# Patient Record
Sex: Female | Born: 1985 | Race: White | Hispanic: No | Smoking: Former smoker
Health system: Southern US, Community
[De-identification: ages and names within clinical notes are randomized; demographics above are authoritative.]

## PROBLEM LIST (undated history)

## (undated) ENCOUNTER — Inpatient Hospital Stay (HOSPITAL_COMMUNITY): Payer: Self-pay

## (undated) DIAGNOSIS — R87629 Unspecified abnormal cytological findings in specimens from vagina: Secondary | ICD-10-CM

## (undated) DIAGNOSIS — R8789 Other abnormal findings in specimens from female genital organs: Secondary | ICD-10-CM

## (undated) DIAGNOSIS — F32A Depression, unspecified: Secondary | ICD-10-CM

## (undated) DIAGNOSIS — Z87898 Personal history of other specified conditions: Secondary | ICD-10-CM

## (undated) DIAGNOSIS — IMO0002 Reserved for concepts with insufficient information to code with codable children: Secondary | ICD-10-CM

## (undated) DIAGNOSIS — Z3490 Encounter for supervision of normal pregnancy, unspecified, unspecified trimester: Principal | ICD-10-CM

## (undated) DIAGNOSIS — F329 Major depressive disorder, single episode, unspecified: Secondary | ICD-10-CM

## (undated) DIAGNOSIS — F419 Anxiety disorder, unspecified: Secondary | ICD-10-CM

## (undated) DIAGNOSIS — K219 Gastro-esophageal reflux disease without esophagitis: Secondary | ICD-10-CM

## (undated) DIAGNOSIS — R87619 Unspecified abnormal cytological findings in specimens from cervix uteri: Secondary | ICD-10-CM

## (undated) HISTORY — DX: Other abnormal findings in specimens from female genital organs: R87.89

## (undated) HISTORY — DX: Unspecified abnormal cytological findings in specimens from cervix uteri: R87.619

## (undated) HISTORY — DX: Gastro-esophageal reflux disease without esophagitis: K21.9

## (undated) HISTORY — DX: Encounter for supervision of normal pregnancy, unspecified, unspecified trimester: Z34.90

## (undated) HISTORY — DX: Anxiety disorder, unspecified: F41.9

## (undated) HISTORY — DX: Reserved for concepts with insufficient information to code with codable children: IMO0002

## (undated) HISTORY — PX: LEEP: SHX91

## (undated) HISTORY — DX: Depression, unspecified: F32.A

## (undated) HISTORY — DX: Major depressive disorder, single episode, unspecified: F32.9

## (undated) HISTORY — PX: DENTAL EXAMINATION UNDER ANESTHESIA: SHX1447

## (undated) HISTORY — DX: Personal history of other specified conditions: Z87.898

## (undated) HISTORY — DX: Unspecified abnormal cytological findings in specimens from vagina: R87.629

---
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
## 2007-05-26 ENCOUNTER — Emergency Department (HOSPITAL_COMMUNITY): Admission: EM | Admit: 2007-05-26 | Discharge: 2007-05-26 | Payer: Self-pay | Admitting: Emergency Medicine

## 2008-03-20 ENCOUNTER — Emergency Department (HOSPITAL_COMMUNITY): Admission: EM | Admit: 2008-03-20 | Discharge: 2008-03-21 | Payer: Self-pay | Admitting: Emergency Medicine

## 2011-02-21 ENCOUNTER — Other Ambulatory Visit (HOSPITAL_COMMUNITY)
Admission: RE | Admit: 2011-02-21 | Discharge: 2011-02-21 | Disposition: A | Discharge status: Home / Self Care | Payer: Medicaid Other | Source: Ambulatory Visit | Attending: Obstetrics & Gynecology | Admitting: Obstetrics & Gynecology

## 2011-02-21 DIAGNOSIS — Z01419 Encounter for gynecological examination (general) (routine) without abnormal findings: Secondary | ICD-10-CM | POA: Insufficient documentation

## 2011-05-12 NOTE — Discharge Summary (Signed)
NAMEMCKYNZIE, LIWANAG             ACCOUNT NO.:  [id number]   MEDICAL RECORD NO.:  [id number]          PATIENT TYPE:  OBV   LOCATION:  A415                          FACILITY:  [facility]   PHYSICIAN:  Tilda Burrow, M.D. DATE OF BIRTH:  Mar 23, 1986   DATE OF ADMISSION:  08/29/2006  DATE OF DISCHARGE:  LH                                 DISCHARGE SUMMARY   HISTORY OF PRESENT ILLNESS:  Javaeh is an 25 year old gravida 1, EDC  September 11, who is in with mild uterine contraction approximately 20  minutes apart.  Medical history is positive for depression and reflux.  Surgical history is positive for a LEEP.   ALLERGIES:  No known drug allergies.   Prenatal course essentially uneventful.  Blood type O positive.  UDS  positive for THC.  Rubella immune.  Hepatitis B surface antigen negative.  HIV nonreactive.  HSV negative.  Serology nonreactive on repeat.  Pap is  LSIL.  GC and Chlamydia negative on both cultures.  GBS positive.  28 week  hemoglobin 11.2, 28 week hematocrit 32.8.  One hour glucose 93.   PHYSICAL EXAMINATION:  Vital signs stable.  Fetal heart rate pattern is  reactive with good accelerations, no decelerations.  Cervix is approximately  1 cm, 50% effaced, presenting part is high, and cervix is posterior.   PLAN:  We are going to discharge home with therapeutic rest and on p.o.  Macrobid due to bacteria in her urine and urinary frequency and she is going  to follow up with [place] in the morning in the office if uterine contractions  have not resolved.      Zerita Boers, Lanier Clam      Tilda Burrow, M.D.  Electronically Signed    DL/MEDQ  D:  04/54/0981  T:  08/29/2006  Job:  [id number]

## 2011-05-12 NOTE — Op Note (Signed)
NAME:  Megan Contreras, Megan Contreras             ACCOUNT NO.:  [id number]   MEDICAL RECORD NO.:  [id number]          PATIENT TYPE:  INP   LOCATION:  A413                          FACILITY:  [facility]   PHYSICIAN:  Lazaro Arms, M.D.   DATE OF BIRTH:  [birth year]   DATE OF PROCEDURE:  09/02/2006  DATE OF DISCHARGE:                                  PROCEDURE NOTE   SUMMARY OF LABOR COURSE:  [name] is an 25 year old gravida 1 at 38-5/7ths  weeks gestation.  She has an epidural in place, in active phase of labor,  who has progressed only to the first stage of labor.  She has been pushing  now for about 20 minutes.  A midline episiotomy is made.  Over this  episiotomy she delivered a viable female infant at 1300 hours, with Apgars of  9 and 9, weighing 7 pounds, 5.5 ounces.  There is a 3-vessel cord.  Cord  blood and cord gas were sent.  The placenta was delivered normally intact at  1310 hours.  The uterus was firm and 3 fingerbreadths below the umbilicus,  and there was appropriate postpartum bleeding.  The midline episiotomy was  repaired in the usual fashion with 3-0 Monocryl without difficulty.   The infant underwent routine neonatal resuscitation.  He was DeLee'd on the  perineum because of the question of possible thin meconium.  The baby is  vigorous and doing well.  Mother is doing well.   Blood loss from delivery is 250 cc.  She will undergo routine postpartum  care.  The epidural catheter was removed with blue tip intact.      Lazaro Arms, M.D.  Electronically Signed     LHE/MEDQ  D:  09/02/2006  T:  09/03/2006  Job:  [id number]

## 2011-05-12 NOTE — Op Note (Signed)
NAME:  Megan Contreras, Megan Contreras NO.:  [id number]   MEDICAL RECORD NO.:  [id number]          PATIENT TYPE:  INP   LOCATION:  A413                          FACILITY:  [facility]   PHYSICIAN:  Lazaro Arms, M.D.   DATE OF BIRTH:  06-03-1986   DATE OF PROCEDURE:  09/01/2006  DATE OF DISCHARGE:                                 OPERATIVE REPORT   Megan Contreras is an 25 year old white female, gravida 1, para 0, estimated date of  delivery of September 13, 2006, currently at 38-5/[redacted] weeks gestation, who  presented to labor and delivery complaining of regular contractions.  She is  4 cm, 80% effaced, 0 station.  Amniotomy is performed with clear liquid.  She is requested an epidural to be placed.  She is placed in a sitting  position.  Betadine prep is used.  Lidocaine 1% is injected into the L3-4  interspace.  A 17 gauge Tuohy needle is used, and loss of resistance  technique employed.  The epidural space is found with one pass without  difficulty.  Bupivacaine plain 1.25% 10 cc is given to the epidural space  without any ill effect.  The epidural catheter is then fed and take-down 5  cm from the epidural space.  An additional 10 cc of 0.125% Bupivacaine is  given and the continuous infusion is begun at 12 cc/hr, 0.125% Bupivacaine  of 2 mcg/cc of fentanyl.  The patient is tolerating it well.  She is  starting to get pain relief due to heart rate tracing stable and blood  pressure stable.      Lazaro Arms, M.D.  Electronically Signed     LHE/MEDQ  D:  09/02/2006  T:  09/03/2006  Job:  [id number]

## 2011-05-12 NOTE — H&P (Signed)
NAME:  [name], [name] NO.:  [id number]   MEDICAL RECORD NO.:  [id number]          PATIENT TYPE:  INP   LOCATION:  LDR2                          FACILITY:  [facility]   PHYSICIAN:  Lazaro Arms, M.D.   DATE OF BIRTH:  10/06/1986   DATE OF ADMISSION:  09/01/2006  DATE OF DISCHARGE:  LH                                HISTORY & PHYSICAL   Megan Contreras is an 25 year old gravida 1, para 0, at 38-1/[redacted] weeks gestation,  estimated date of delivery of September 13, 2006, who has had an  unremarkable pregnancy.  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  She is also Group B Strep positive, but otherwise pregnancy has  been unremarkable.  She presented to labor and delivery complaining of  regular uterine contractions.  She is now 4 cm, 80% effaced.   PAST MEDICAL HISTORY:  Major depression.   PAST SURGICAL HISTORY:  A LEEP for cervical dysplasia.   PAST OB:  She is nulliparous.   MEDICATIONS:  Prozac and Aciphex.   BLOOD TYPE:  O positive.   RUBELLA:  Immune.   ANTIBODY SCREEN:  Negative.   HEPATITIS B:  Negative.   HIV:  Nonreactive.   HSV:  Negative for type 2.   SEROLOGIES:  Nonreactive x2.   PAP:  Is as above.   GC AND CHLAMYDIA:  Negative x2.   GROUP B STREP:  Positive.   GLUCOLA:  93.   IMPRESSION:  1. Intrauterine pregnancy at 38-5/[redacted] weeks gestation.  2. Active phase of labor.   PLAN:  Patient is admitted to Labor & Delivery for expectant management.  She will have an epidural placed.      Lazaro Arms, M.D.  Electronically Signed     LHE/MEDQ  D:  09/02/2006  T:  09/02/2006  Job:  [id number]

## 2013-04-26 ENCOUNTER — Encounter: Payer: Self-pay | Admitting: *Deleted

## 2013-04-26 DIAGNOSIS — Z87898 Personal history of other specified conditions: Secondary | ICD-10-CM

## 2013-04-26 DIAGNOSIS — R87612 Low grade squamous intraepithelial lesion on cytologic smear of cervix (LGSIL): Secondary | ICD-10-CM | POA: Insufficient documentation

## 2013-04-26 DIAGNOSIS — F329 Major depressive disorder, single episode, unspecified: Secondary | ICD-10-CM

## 2013-04-28 ENCOUNTER — Other Ambulatory Visit: Payer: Self-pay | Admitting: Obstetrics & Gynecology

## 2013-07-04 ENCOUNTER — Ambulatory Visit (INDEPENDENT_AMBULATORY_CARE_PROVIDER_SITE_OTHER): Payer: Medicaid Other | Admitting: Adult Health

## 2013-07-04 ENCOUNTER — Other Ambulatory Visit: Payer: Self-pay | Admitting: Obstetrics & Gynecology

## 2013-07-04 ENCOUNTER — Encounter: Payer: Self-pay | Admitting: Adult Health

## 2013-07-04 VITALS — BP 124/88 | Ht 62.0 in | Wt 163.0 lb

## 2013-07-04 DIAGNOSIS — Z3201 Encounter for pregnancy test, result positive: Secondary | ICD-10-CM

## 2013-07-04 DIAGNOSIS — O3680X0 Pregnancy with inconclusive fetal viability, not applicable or unspecified: Secondary | ICD-10-CM

## 2013-07-04 LAB — POCT URINE PREGNANCY: Preg Test, Ur: POSITIVE

## 2013-07-04 NOTE — Progress Notes (Signed)
Pt here with positive pregnancy test. Not having any problems at this time. New OB scheduled for 2 weeks.

## 2013-07-10 ENCOUNTER — Ambulatory Visit (INDEPENDENT_AMBULATORY_CARE_PROVIDER_SITE_OTHER): Payer: Medicaid Other

## 2013-07-10 DIAGNOSIS — O3680X0 Pregnancy with inconclusive fetal viability, not applicable or unspecified: Secondary | ICD-10-CM

## 2013-07-10 NOTE — Progress Notes (Signed)
Single gestational sac meas. @ [redacted]w[redacted]d, with ys-nml size/shape. No embryo visualized on today's scan Too early. cx long and closed, bilat. Ovs. Seen, rt ov w/ c.l. = 16 mm, avascular.

## 2013-07-14 ENCOUNTER — Encounter: Payer: Medicaid Other | Admitting: Women's Health

## 2013-07-17 ENCOUNTER — Other Ambulatory Visit: Payer: Self-pay | Admitting: Obstetrics & Gynecology

## 2013-07-17 DIAGNOSIS — O3680X0 Pregnancy with inconclusive fetal viability, not applicable or unspecified: Secondary | ICD-10-CM

## 2013-07-18 ENCOUNTER — Other Ambulatory Visit: Payer: Medicaid Other

## 2013-07-22 ENCOUNTER — Ambulatory Visit (INDEPENDENT_AMBULATORY_CARE_PROVIDER_SITE_OTHER): Payer: Medicaid Other

## 2013-07-22 ENCOUNTER — Other Ambulatory Visit (HOSPITAL_COMMUNITY)
Admission: RE | Admit: 2013-07-22 | Discharge: 2013-07-22 | Disposition: A | Discharge status: Home / Self Care | Payer: Medicaid Other | Source: Ambulatory Visit | Attending: Adult Health | Admitting: Adult Health

## 2013-07-22 ENCOUNTER — Encounter: Payer: Self-pay | Admitting: Adult Health

## 2013-07-22 ENCOUNTER — Encounter: Payer: Medicaid Other | Admitting: Women's Health

## 2013-07-22 ENCOUNTER — Ambulatory Visit (INDEPENDENT_AMBULATORY_CARE_PROVIDER_SITE_OTHER): Payer: Medicaid Other | Admitting: Adult Health

## 2013-07-22 VITALS — BP 120/80 | Wt 162.0 lb

## 2013-07-22 DIAGNOSIS — Z3490 Encounter for supervision of normal pregnancy, unspecified, unspecified trimester: Secondary | ICD-10-CM

## 2013-07-22 DIAGNOSIS — Z1272 Encounter for screening for malignant neoplasm of vagina: Secondary | ICD-10-CM

## 2013-07-22 DIAGNOSIS — O3680X0 Pregnancy with inconclusive fetal viability, not applicable or unspecified: Secondary | ICD-10-CM

## 2013-07-22 DIAGNOSIS — Z3480 Encounter for supervision of other normal pregnancy, unspecified trimester: Secondary | ICD-10-CM | POA: Insufficient documentation

## 2013-07-22 DIAGNOSIS — Z3481 Encounter for supervision of other normal pregnancy, first trimester: Secondary | ICD-10-CM

## 2013-07-22 DIAGNOSIS — Z01419 Encounter for gynecological examination (general) (routine) without abnormal findings: Secondary | ICD-10-CM | POA: Insufficient documentation

## 2013-07-22 DIAGNOSIS — Z331 Pregnant state, incidental: Secondary | ICD-10-CM

## 2013-07-22 DIAGNOSIS — Z1389 Encounter for screening for other disorder: Secondary | ICD-10-CM

## 2013-07-22 DIAGNOSIS — Z113 Encounter for screening for infections with a predominantly sexual mode of transmission: Secondary | ICD-10-CM | POA: Insufficient documentation

## 2013-07-22 HISTORY — DX: Encounter for supervision of normal pregnancy, unspecified, unspecified trimester: Z34.90

## 2013-07-22 LAB — POCT URINALYSIS DIPSTICK
Blood, UA: NEGATIVE
Ketones, UA: NEGATIVE

## 2013-07-22 LAB — CBC
MCV: 92.4 fL (ref 78.0–100.0)
Platelets: 312 10*3/uL (ref 150–400)
RDW: 12.5 % (ref 11.5–15.5)
WBC: 13.7 10*3/uL — ABNORMAL HIGH (ref 4.0–10.5)

## 2013-07-22 NOTE — Progress Notes (Signed)
U/S-single IUP with +FCA noted, cx long and closed, bilateral adnexa wnl with C.L. Noted on RT, no free fluid noted, CRL c/w 6+4wks EDD 03/13/2014

## 2013-07-22 NOTE — Patient Instructions (Addendum)

## 2013-07-22 NOTE — Progress Notes (Signed)
Pt here today for New OB visit. Pt states that she is having some pain on her lower right side. Pt denies any other problems or concerns at this time. Pt given CCNC form and lab consents to read over and sign.

## 2013-07-22 NOTE — Progress Notes (Signed)
  Subjective:    Megan Contreras is a 27 y.o. G62P1000 Caucasian female at [redacted]w[redacted]d by [place] being seen today for her first obstetrical visit.  Her obstetrical history is significant for history leep.  Pregnancy history fully reviewed.   Patient reports some nausea and consstipation.  Filed Vitals:   07/22/13 1349  BP: 120/80  Weight: 162 lb (73.483 kg)    HISTORY: OB History   Grav Para Term Preterm Abortions TAB SAB Ect Mult Living   2 1 1             # Outc Date GA Lbr Len/2nd Wgt Sex Del Anes PTL Lv   1 TRM 9/07 [redacted]w[redacted]d  7lb5.5oz(3.331kg) M SVD EPI     2 CUR              Past Medical History  Diagnosis Date  . Depression   . History of abnormal Pap smear   . 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 Pregnant 07/22/2013   Past Surgical History  Procedure Laterality Date  . Leep    . Dental examination under anesthesia     Family History  Problem Relation Age of Onset  . Hypertension Other   . Cancer Other   . Heart disease Paternal Grandfather      Exam    Pelvic Exam:    Perineum: Normal Perineum   Vulva: normal   Vagina:  normal mucosa, normal discharge, no palpable nodules   Uterus   7 week     Cervix: normal   Adnexa: Not palpable   Urinary: urethral meatus normal    System:     Skin: normal coloration and turgor, no rashes    Neurologic: oriented, normal mood   Extremities: normal strength, tone, and muscle mass   HEENT PERRLA   Mouth/Teeth mucous membranes moist   Cardiovascular: regular rate and rhythm   Respiratory:  appears well, vitals normal, no respiratory distress, acyanotic, normal RR   Abdomen: soft, non-tender    Thin prep pap smear performed.   Assessment:    Pregnancy: G2P1000 Patient Active Problem List   Diagnosis Date Noted  . Pregnant 07/22/2013  . Depression 04/26/2013  . History of abnormal Pap smear 04/26/2013      [redacted]w[redacted]d G2P1000 New OB visit    Plan:     Initial labs drawn Continue prenatal vitamins Problem list  reviewed and updated Reviewed n/v relief measures and warning s/s to report Reviewed recommended weight gain based on pre-gravid BMI Encouraged well-balanced diet and try increasing fiber Genetic Screening discussed Integrated Screen: requested Cystic fibrosis screening discussed requested Ultrasound discussed; fetal survey: requested Follow up in 4 weeks to see Selena Batten for OB visit and schedule IT/NT then  GRIFFIN,JENNIFER 07/22/2013 2:28 PM

## 2013-07-23 LAB — TSH: TSH: 0.166 u[IU]/mL — ABNORMAL LOW (ref 0.350–4.500)

## 2013-07-23 LAB — URINALYSIS
Bilirubin Urine: NEGATIVE
Ketones, ur: NEGATIVE mg/dL
Protein, ur: NEGATIVE mg/dL
Urobilinogen, UA: 0.2 mg/dL (ref 0.0–1.0)

## 2013-07-23 LAB — DRUG SCREEN, URINE, NO CONFIRMATION
Benzodiazepines.: NEGATIVE
Methadone: NEGATIVE
Propoxyphene: NEGATIVE

## 2013-07-23 LAB — ANTIBODY SCREEN: Antibody Screen: NEGATIVE

## 2013-07-23 LAB — OXYCODONE SCREEN, UA, RFLX CONFIRM: Oxycodone Screen, Ur: NEGATIVE ng/mL

## 2013-07-23 LAB — RPR

## 2013-07-23 LAB — ABO AND RH: Rh Type: POSITIVE

## 2013-07-23 LAB — URINE CULTURE: Colony Count: 30000

## 2013-07-24 ENCOUNTER — Telehealth: Payer: Self-pay | Admitting: Adult Health

## 2013-07-24 LAB — CYSTIC FIBROSIS DIAGNOSTIC STUDY

## 2013-07-25 ENCOUNTER — Telehealth: Payer: Self-pay | Admitting: Adult Health

## 2013-07-25 NOTE — Telephone Encounter (Signed)
No voicemail box setup yet.

## 2013-07-25 NOTE — Telephone Encounter (Signed)
See other call

## 2013-07-28 ENCOUNTER — Ambulatory Visit (INDEPENDENT_AMBULATORY_CARE_PROVIDER_SITE_OTHER): Payer: Medicaid Other | Admitting: Adult Health

## 2013-07-28 VITALS — BP 120/80 | Ht 62.0 in | Wt 165.0 lb

## 2013-07-28 DIAGNOSIS — A5400 Gonococcal infection of lower genitourinary tract, unspecified: Secondary | ICD-10-CM

## 2013-07-28 DIAGNOSIS — O98211 Gonorrhea complicating pregnancy, first trimester: Secondary | ICD-10-CM

## 2013-07-28 DIAGNOSIS — A5401 Gonococcal cystitis and urethritis, unspecified: Secondary | ICD-10-CM

## 2013-07-28 MED ORDER — AZITHROMYCIN 500 MG PO TABS: ORAL_TABLET | ORAL | Status: DC

## 2013-07-28 MED ORDER — CEFTRIAXONE SODIUM 1 G IJ SOLR
250.0000 mg | Freq: Once | INTRAMUSCULAR | Status: AC
  Administered 2013-07-28: 250 mg via INTRAMUSCULAR

## 2013-07-28 NOTE — Addendum Note (Signed)
Addended by: Cyril Mourning A on: 07/28/2013 01:57 PM   Modules accepted: Orders

## 2013-07-28 NOTE — Progress Notes (Signed)
Pt here for Rocephin Injection for +GC.

## 2013-08-19 ENCOUNTER — Encounter: Payer: Medicaid Other | Admitting: Women's Health

## 2013-08-26 ENCOUNTER — Encounter: Payer: Self-pay | Admitting: Women's Health

## 2013-08-26 ENCOUNTER — Ambulatory Visit (INDEPENDENT_AMBULATORY_CARE_PROVIDER_SITE_OTHER): Payer: Medicaid Other

## 2013-08-26 ENCOUNTER — Other Ambulatory Visit: Payer: Self-pay | Admitting: Women's Health

## 2013-08-26 ENCOUNTER — Ambulatory Visit (INDEPENDENT_AMBULATORY_CARE_PROVIDER_SITE_OTHER): Payer: Medicaid Other | Admitting: Women's Health

## 2013-08-26 VITALS — BP 130/70 | Wt 165.0 lb

## 2013-08-26 DIAGNOSIS — Z36 Encounter for antenatal screening of mother: Secondary | ICD-10-CM

## 2013-08-26 DIAGNOSIS — O3440 Maternal care for other abnormalities of cervix, unspecified trimester: Secondary | ICD-10-CM

## 2013-08-26 DIAGNOSIS — Z331 Pregnant state, incidental: Secondary | ICD-10-CM

## 2013-08-26 DIAGNOSIS — O98319 Other infections with a predominantly sexual mode of transmission complicating pregnancy, unspecified trimester: Secondary | ICD-10-CM

## 2013-08-26 DIAGNOSIS — Z3481 Encounter for supervision of other normal pregnancy, first trimester: Secondary | ICD-10-CM

## 2013-08-26 DIAGNOSIS — Z1389 Encounter for screening for other disorder: Secondary | ICD-10-CM

## 2013-08-26 DIAGNOSIS — O98219 Gonorrhea complicating pregnancy, unspecified trimester: Secondary | ICD-10-CM | POA: Insufficient documentation

## 2013-08-26 DIAGNOSIS — F329 Major depressive disorder, single episode, unspecified: Secondary | ICD-10-CM

## 2013-08-26 DIAGNOSIS — O98211 Gonorrhea complicating pregnancy, first trimester: Secondary | ICD-10-CM

## 2013-08-26 LAB — POCT URINALYSIS DIPSTICK
Glucose, UA: NEGATIVE
Ketones, UA: NEGATIVE
Leukocytes, UA: NEGATIVE
Protein, UA: NEGATIVE

## 2013-08-26 NOTE — Patient Instructions (Signed)
Migraine Headache A migraine headache is an intense, throbbing pain on one or both sides of your head. A migraine can last for 30 minutes to several hours. CAUSES  The exact cause of a migraine headache is not always known. However, a migraine may be caused when nerves in the brain become irritated and release chemicals that cause inflammation. This causes pain. SYMPTOMS  Pain on one or both sides of your head.  Pulsating or throbbing pain.  Severe pain that prevents daily activities.  Pain that is aggravated by any physical activity.  Nausea, vomiting, or both.  Dizziness.  Pain with exposure to bright lights, loud noises, or activity.  General sensitivity to bright lights, loud noises, or smells. Before you get a migraine, you may get warning signs that a migraine is coming (aura). An aura may include:  Seeing flashing lights.  Seeing bright spots, halos, or zig-zag lines.  Having tunnel vision or blurred vision.  Having feelings of numbness or tingling.  Having trouble talking.  Having muscle weakness. MIGRAINE TRIGGERS  Alcohol.  Smoking.  Stress.  Menstruation.  Aged cheeses.  Foods or drinks that contain nitrates, glutamate, aspartame, or tyramine.  Lack of sleep.  Chocolate.  Caffeine.  Hunger.  Physical exertion.  Fatigue.  Medicines used to treat chest pain (nitroglycerine), birth control pills, estrogen, and some blood pressure medicines. DIAGNOSIS  A migraine headache is often diagnosed based on:  Symptoms.  Physical examination.  A CT scan or MRI of your head. TREATMENT Medicines may be given for pain and nausea. Medicines can also be given to help prevent recurrent migraines.  HOME CARE INSTRUCTIONS  Only take over-the-counter or prescription medicines for pain or discomfort as directed by your caregiver. The use of long-term narcotics is not recommended.  Lie down in a dark, quiet room when you have a migraine.  Keep a journal  to find out what may trigger your migraine headaches. For example, write down:  What you eat and drink.  How much sleep you get.  Any change to your diet or medicines.  Limit alcohol consumption.  Quit smoking if you smoke.  Get 7 to 9 hours of sleep, or as recommended by your caregiver.  Limit stress.  Keep lights dim if bright lights bother you and make your migraines worse. SEEK IMMEDIATE MEDICAL CARE IF:   Your migraine becomes severe.  You have a fever.  You have a stiff neck.  You have vision loss.  You have muscular weakness or loss of muscle control.  You start losing your balance or have trouble walking.  You feel faint or pass out.  You have severe symptoms that are different from your first symptoms. MAKE SURE YOU:   Understand these instructions.  Will watch your condition.  Will get help right away if you are not doing well or get worse. Document Released: 12/11/2005 Document Revised: 03/04/2012 Document Reviewed: 12/01/2011 [employer] Patient Information 2014 [employer].  

## 2013-08-26 NOTE — Progress Notes (Signed)
U/S-(11+4wks)-single IUP with +FCA noted, CRL c/w dates, NB present, NT=1.37mm, cx long and closed, bilateral adnexa wnl

## 2013-08-26 NOTE — Progress Notes (Signed)
Denies cramping, lof, vb, urinary frequency, urgency, hesitancy, or dysuria.  Headaches daily, apap helps some.  GC POC today. Reviewed HA prevention/relief measures, warning s/s to report.  All questions answered. 1st it/nt today. F/U in 4wks for 2nd IT and visit.

## 2013-08-27 LAB — GC/CHLAMYDIA PROBE AMP
CT Probe RNA: NEGATIVE
GC Probe RNA: NEGATIVE

## 2013-08-29 ENCOUNTER — Encounter: Payer: Self-pay | Admitting: Women's Health

## 2013-09-23 ENCOUNTER — Encounter: Payer: Medicaid Other | Admitting: Obstetrics & Gynecology

## 2013-10-27 ENCOUNTER — Other Ambulatory Visit: Payer: Self-pay | Admitting: Obstetrics & Gynecology

## 2013-10-27 ENCOUNTER — Ambulatory Visit (INDEPENDENT_AMBULATORY_CARE_PROVIDER_SITE_OTHER): Payer: Medicaid Other | Admitting: Obstetrics & Gynecology

## 2013-10-27 ENCOUNTER — Encounter (INDEPENDENT_AMBULATORY_CARE_PROVIDER_SITE_OTHER): Payer: Self-pay

## 2013-10-27 ENCOUNTER — Encounter: Payer: Self-pay | Admitting: Obstetrics & Gynecology

## 2013-10-27 VITALS — BP 120/80 | Wt 175.0 lb

## 2013-10-27 DIAGNOSIS — Z1389 Encounter for screening for other disorder: Secondary | ICD-10-CM

## 2013-10-27 DIAGNOSIS — O3440 Maternal care for other abnormalities of cervix, unspecified trimester: Secondary | ICD-10-CM

## 2013-10-27 DIAGNOSIS — Z331 Pregnant state, incidental: Secondary | ICD-10-CM

## 2013-10-27 LAB — POCT URINALYSIS DIPSTICK
Ketones, UA: NEGATIVE
Leukocytes, UA: NEGATIVE
Protein, UA: NEGATIVE

## 2013-10-27 NOTE — Progress Notes (Signed)
BP weight and urine results all reviewed and noted. Patient reports good fetal movement, denies any bleeding and no rupture of membranes symptoms or regular contractions. Patient is without complaints. All questions were answered.  

## 2013-11-02 LAB — MATERNAL SCREEN, INTEGRATED #2
AFP MoM: 1.15
AFP, Serum: 77.4 ng/mL
Calculated Gestational Age: 20.7
Estriol, Free: 1.92 ng/mL
Inhibin A MoM: 0.72
MSS Down Syndrome: <1:5000 {titer}
NT MoM: 1.24
Nuchal Translucency: 1.6 mm
Number of fetuses: 1
PAPP-A: 668 ng/mL
hCG, Serum: 7.4 IU/mL

## 2013-11-03 ENCOUNTER — Encounter: Payer: Self-pay | Admitting: Women's Health

## 2013-11-10 ENCOUNTER — Ambulatory Visit (INDEPENDENT_AMBULATORY_CARE_PROVIDER_SITE_OTHER): Payer: Medicaid Other

## 2013-11-10 ENCOUNTER — Other Ambulatory Visit: Payer: Self-pay | Admitting: Obstetrics & Gynecology

## 2013-11-10 ENCOUNTER — Ambulatory Visit (INDEPENDENT_AMBULATORY_CARE_PROVIDER_SITE_OTHER): Payer: Medicaid Other | Admitting: Women's Health

## 2013-11-10 ENCOUNTER — Encounter: Payer: Self-pay | Admitting: Women's Health

## 2013-11-10 VITALS — BP 112/70 | Wt 179.5 lb

## 2013-11-10 DIAGNOSIS — O4412 Placenta previa with hemorrhage, second trimester: Secondary | ICD-10-CM

## 2013-11-10 DIAGNOSIS — O4410 Placenta previa with hemorrhage, unspecified trimester: Secondary | ICD-10-CM

## 2013-11-10 DIAGNOSIS — Z1389 Encounter for screening for other disorder: Secondary | ICD-10-CM

## 2013-11-10 DIAGNOSIS — O3440 Maternal care for other abnormalities of cervix, unspecified trimester: Secondary | ICD-10-CM

## 2013-11-10 DIAGNOSIS — O4440 Low lying placenta NOS or without hemorrhage, unspecified trimester: Secondary | ICD-10-CM | POA: Insufficient documentation

## 2013-11-10 DIAGNOSIS — O4402 Placenta previa specified as without hemorrhage, second trimester: Secondary | ICD-10-CM

## 2013-11-10 DIAGNOSIS — O4400 Placenta previa specified as without hemorrhage, unspecified trimester: Secondary | ICD-10-CM

## 2013-11-10 DIAGNOSIS — Z331 Pregnant state, incidental: Secondary | ICD-10-CM

## 2013-11-10 DIAGNOSIS — Z3482 Encounter for supervision of other normal pregnancy, second trimester: Secondary | ICD-10-CM

## 2013-11-10 DIAGNOSIS — Z23 Encounter for immunization: Secondary | ICD-10-CM

## 2013-11-10 LAB — POCT URINALYSIS DIPSTICK
Blood, UA: NEGATIVE
Ketones, UA: NEGATIVE
Leukocytes, UA: NEGATIVE
Nitrite, UA: NEGATIVE
Protein, UA: NEGATIVE

## 2013-11-10 MED ORDER — INFLUENZA VAC SPLIT QUAD 0.5 ML IM SUSP
0.5000 mL | Freq: Once | INTRAMUSCULAR | Status: AC
  Administered 2013-11-10: 0.5 mL via INTRAMUSCULAR

## 2013-11-10 NOTE — Progress Notes (Signed)
Missed last appt. Reports good fm. Denies uc's, lof, vb, urinary frequency, urgency, hesitancy, or dysuria.  RLP and bad reflux, using many rolaids/day w/o relief.  Reviewed today's u/s, recommended OTC prilosec/nexium/or protonix, gave info on foods to avoid.  All questions answered. F/U in 4wks for pn2 and vist.

## 2013-11-10 NOTE — Progress Notes (Signed)
U/S(22+3wks)-active fetus, meas c/w dates, fluid wnl, no major abnl noted, female fetus, bilateral adnexa wnl, cx-5.2cm closed, anterior gr 0 plac low-lying would like to reck placenta at 28 weeks

## 2013-11-10 NOTE — Patient Instructions (Addendum)
You will have your sugar test next visit.  Please do not eat or drink anything after midnight the night before you come, not even water.  You will be here for at least two hours.     Heartburn During Pregnancy  Heartburn is a burning sensation in the chest caused by stomach acid backing up into the esophagus. Heartburn (also known as "reflux") is common in pregnancy because a certain hormone (progesterone) changes. The progesterone hormone may relax the valve that separates the esophagus from the stomach. This allows acid to go up into the esophagus, causing heartburn. Heartburn may also happen in pregnancy because the enlarging uterus pushes up on the stomach, which pushes more acid into the esophagus. This is especially true in the later stages of pregnancy. Heartburn problems usually go away after giving birth. CAUSES   The progesterone hormone.  Changing hormone levels.  The growing uterus that pushes stomach acid upward.  Large meals.  Certain foods and drinks.  Exercise.  Increased acid production. SYMPTOMS   Burning pain in the chest or lower throat.  Bitter taste in the mouth.  Coughing. DIAGNOSIS  Heartburn is typically diagnosed by your caregiver when taking a careful history of your concern. Your caregiver may order a blood test to check for a certain type of bacteria that is associated with heartburn. Sometimes, heartburn is diagnosed by prescribing a heartburn medicine to see if the symptoms improve. It is rare in pregnancy to have a procedure called an endoscopy. This is when a tube with a light and a camera on the end is used to examine the esophagus and the stomach. TREATMENT   Your caregiver may tell you to use certain over-the-counter medicines (antacids, acid reducers) for mild heartburn.  Your caregiver may prescribe medicines to decrease stomach acid or to protect your stomach lining.  Your caregiver may recommend certain diet changes.  For severe cases, your  caregiver may recommend that the head of the bed be elevated on blocks. (Sleeping with more pillows is not an effective treatment as it only changes the position of your head and does not improve the main problem of stomach acid refluxing into the esophagus.) HOME CARE INSTRUCTIONS   Take all medicines as directed by your caregiver.  Raise the head of your bed by putting blocks under the legs if instructed to by your caregiver.  Do not exercise right after eating.  Avoid eating 2 or 3 hours before bed. Do not lie down right after eating.  Eat small meals throughout the day instead of 3 large meals.  Identify foods and beverages that make your symptoms worse and avoid them. Foods you may want to avoid include:  Peppers.  Chocolate.  High-fat foods, including fried foods.  Spicy foods.  Garlic and onions.  Citrus fruits, including oranges, grapefruit, lemons, and limes.  Food containing tomatoes or tomato products.  Mint.  Carbonated and caffeinated drinks.  Vinegar. SEEK IMMEDIATE MEDICAL CARE IF:   You have severe chest pain that goes down your arm or into your jaw or neck.  You feel sweaty, dizzy, or lightheaded.  You become short of breath.  You vomit blood.  You have difficulty or pain with swallowing.  You have bloody or black, tarry stools.  You have episodes of heartburn more than 3 times a week, for more than 2 weeks. MAKE SURE YOU:  Understand these instructions.  Will watch your condition.  Will get help right away if you are not doing well  or get worse. Document Released: 12/08/2000 Document Revised: 03/04/2012 Document Reviewed: 07/30/2013 [facility] Patient Information 2014 [place], [place].

## 2013-12-08 ENCOUNTER — Ambulatory Visit (INDEPENDENT_AMBULATORY_CARE_PROVIDER_SITE_OTHER): Payer: Medicaid Other | Admitting: Obstetrics & Gynecology

## 2013-12-08 ENCOUNTER — Encounter: Payer: Self-pay | Admitting: Obstetrics & Gynecology

## 2013-12-08 ENCOUNTER — Other Ambulatory Visit: Payer: Medicaid Other

## 2013-12-08 ENCOUNTER — Encounter (INDEPENDENT_AMBULATORY_CARE_PROVIDER_SITE_OTHER): Payer: Self-pay

## 2013-12-08 VITALS — BP 110/60 | Wt 181.0 lb

## 2013-12-08 DIAGNOSIS — Z3480 Encounter for supervision of other normal pregnancy, unspecified trimester: Secondary | ICD-10-CM

## 2013-12-08 DIAGNOSIS — Z1389 Encounter for screening for other disorder: Secondary | ICD-10-CM

## 2013-12-08 DIAGNOSIS — O99019 Anemia complicating pregnancy, unspecified trimester: Secondary | ICD-10-CM

## 2013-12-08 DIAGNOSIS — Z331 Pregnant state, incidental: Secondary | ICD-10-CM

## 2013-12-08 LAB — POCT URINALYSIS DIPSTICK
Glucose, UA: NEGATIVE
Ketones, UA: NEGATIVE
Leukocytes, UA: NEGATIVE
Nitrite, UA: NEGATIVE

## 2013-12-08 LAB — CBC
MCHC: 34.5 g/dL (ref 30.0–36.0)
MCV: 92.9 fL (ref 78.0–100.0)
Platelets: 283 10*3/uL (ref 150–400)
RBC: 3.65 MIL/uL — ABNORMAL LOW (ref 3.87–5.11)
RDW: 13.2 % (ref 11.5–15.5)
WBC: 15.8 10*3/uL — ABNORMAL HIGH (ref 4.0–10.5)

## 2013-12-08 NOTE — Addendum Note (Signed)
Addended by: Colen Darling on: 12/08/2013 10:23 AM   Modules accepted: Orders

## 2013-12-08 NOTE — Progress Notes (Signed)
BP weight and urine results all reviewed and noted. Patient reports good fetal movement, denies any bleeding and no rupture of membranes symptoms or regular contractions. Patient is without complaints. All questions were answered.  

## 2013-12-09 LAB — HIV ANTIBODY (ROUTINE TESTING W REFLEX): HIV: NONREACTIVE

## 2013-12-09 LAB — GLUCOSE TOLERANCE, 2 HOURS
Glucose, 2 hour: 109 mg/dL (ref 70–139)
Glucose, Fasting: 83 mg/dL (ref 70–99)

## 2013-12-09 LAB — HSV 2 ANTIBODY, IGG: HSV 2 Glycoprotein G Ab, IgG: 0.16 IV

## 2013-12-09 LAB — ANTIBODY SCREEN: Antibody Screen: NEGATIVE

## 2013-12-09 LAB — RPR

## 2013-12-25 NOTE — L&D Delivery Note (Addendum)
Delivery Note At 1:52 PM a viable female was delivered via SVD (Presentation: LOA);  APGAR and weights pending.  Placenta status: spontaneous, intact.  Cord 3-vessel:  with the following complications: after presentation of head, non-reducible nuchal cord x1 and shoulder dystocia noted. McRobert's relieved and after delivery of anterior shoulder, posterior shoulder was extracted. Infant slow to respond, handed to nursing who placed him under warmers. He began crying under warmer, and was taken to nursery for slow response. Cord pH: pending.   Anesthesia: Epidural  Episiotomy: N/A Lacerations: R labial 1st degree; superficial posterior perineal tear. Suture Repair: N/A Est. Blood Loss (mL): 300   Mom to postpartum.  Baby to Nursery.  Ms. Megan Contreras presented for PROM and progressed to active labor with pitocin augmentation. She was found to be complete this morning, and underwent SVD an noted above. Megan Contreras, CNM, was present and supervising, and assisted with dystocia relieving maneuvers. Dr. Tinnie Gensanya Contreras was also present and supervising the delivery. Counts correct x2.   Megan Contreras, Megan Contreras, 2:09 PM  See my other note I attended delivery also as did Dr Shawnie PonsPratt.  SHoulder dystocia was mild and relieved with McRoberts. Time to delivery was less than 30 seconds.  Unable to reduce nuchal and body cord, so delivered through it and when baby appeared to have poor tone, cord was clamped and cut with baby handed to Pediatric team. Baby taken to nursery.   Agree with note. Megan Contreras, CNM

## 2013-12-25 NOTE — L&D Delivery Note (Signed)
I attended delivery as did Dr Shawnie PonsPratt.  SHoulder dystocia was mild and relieved with McRoberts. Time to delivery was less than 30 seconds.   Unable to reduce nuchal and body cord, so delivered through it and when baby appeared to have poor tone, cord was clamped and cut with baby handed to Pediatric team.  Baby taken to nursery.   Agree with note.

## 2013-12-29 ENCOUNTER — Ambulatory Visit (INDEPENDENT_AMBULATORY_CARE_PROVIDER_SITE_OTHER): Payer: Medicaid Other | Admitting: Women's Health

## 2013-12-29 ENCOUNTER — Encounter (INDEPENDENT_AMBULATORY_CARE_PROVIDER_SITE_OTHER): Payer: Self-pay

## 2013-12-29 ENCOUNTER — Encounter: Payer: Self-pay | Admitting: Women's Health

## 2013-12-29 VITALS — BP 110/70 | Wt 184.0 lb

## 2013-12-29 DIAGNOSIS — Z1389 Encounter for screening for other disorder: Secondary | ICD-10-CM

## 2013-12-29 DIAGNOSIS — Z3483 Encounter for supervision of other normal pregnancy, third trimester: Secondary | ICD-10-CM

## 2013-12-29 DIAGNOSIS — Z331 Pregnant state, incidental: Secondary | ICD-10-CM

## 2013-12-29 DIAGNOSIS — O99019 Anemia complicating pregnancy, unspecified trimester: Secondary | ICD-10-CM

## 2013-12-29 DIAGNOSIS — O4413 Placenta previa with hemorrhage, third trimester: Secondary | ICD-10-CM

## 2013-12-29 LAB — POCT URINALYSIS DIPSTICK
Blood, UA: NEGATIVE
Glucose, UA: NEGATIVE
KETONES UA: NEGATIVE
Leukocytes, UA: NEGATIVE
Nitrite, UA: NEGATIVE
Protein, UA: NEGATIVE

## 2013-12-29 NOTE — Patient Instructions (Signed)
Third Trimester of Pregnancy  The third trimester is from week 29 through week 42, months 7 through 9. The third trimester is a time when the fetus is growing rapidly. At the end of the ninth month, the fetus is about 20 inches in length and weighs 6 10 pounds.   BODY CHANGES  Your body goes through many changes during pregnancy. The changes vary from woman to woman.    Your weight will continue to increase. You can expect to gain 25 35 pounds (11 16 kg) by the end of the pregnancy.   You may begin to get stretch marks on your hips, abdomen, and breasts.   You may urinate more often because the fetus is moving lower into your pelvis and pressing on your bladder.   You may develop or continue to have heartburn as a result of your pregnancy.   You may develop constipation because certain hormones are causing the muscles that push waste through your intestines to slow down.   You may develop hemorrhoids or swollen, bulging veins (varicose veins).   You may have pelvic pain because of the weight gain and pregnancy hormones relaxing your joints between the bones in your pelvis. Back aches may result from over exertion of the muscles supporting your posture.   Your breasts will continue to grow and be tender. A yellow discharge may leak from your breasts called colostrum.   Your belly button may stick out.   You may feel short of breath because of your expanding uterus.   You may notice the fetus "dropping," or moving lower in your abdomen.   You may have a bloody mucus discharge. This usually occurs a few days to a week before labor begins.   Your cervix becomes thin and soft (effaced) near your due date.  WHAT TO EXPECT AT YOUR PRENATAL EXAMS   You will have prenatal exams every 2 weeks until week 36. Then, you will have weekly prenatal exams. During a routine prenatal visit:   You will be weighed to make sure you and the fetus are growing normally.   Your blood pressure is taken.   Your abdomen will be  measured to track your baby's growth.   The fetal heartbeat will be listened to.   Any test results from the previous visit will be discussed.   You may have a cervical check near your due date to see if you have effaced.  At around 36 weeks, your caregiver will check your cervix. At the same time, your caregiver will also perform a test on the secretions of the vaginal tissue. This test is to determine if a type of bacteria, Group B streptococcus, is present. Your caregiver will explain this further.  Your caregiver may ask you:   What your birth plan is.   How you are feeling.   If you are feeling the baby move.   If you have had any abnormal symptoms, such as leaking fluid, bleeding, severe headaches, or abdominal cramping.   If you have any questions.  Other tests or screenings that may be performed during your third trimester include:   Blood tests that check for low iron levels (anemia).   Fetal testing to check the health, activity level, and growth of the fetus. Testing is done if you have certain medical conditions or if there are problems during the pregnancy.  FALSE LABOR  You may feel small, irregular contractions that eventually go away. These are called Braxton Hicks contractions, or   false labor. Contractions may last for hours, days, or even weeks before true labor sets in. If contractions come at regular intervals, intensify, or become painful, it is best to be seen by your caregiver.   SIGNS OF LABOR    Menstrual-like cramps.   Contractions that are 5 minutes apart or less.   Contractions that start on the top of the uterus and spread down to the lower abdomen and back.   A sense of increased pelvic pressure or back pain.   A watery or bloody mucus discharge that comes from the vagina.  If you have any of these signs before the 37th week of pregnancy, call your caregiver right away. You need to go to the hospital to get checked immediately.  HOME CARE INSTRUCTIONS    Avoid all  smoking, herbs, alcohol, and unprescribed drugs. These chemicals affect the formation and growth of the baby.   Follow your caregiver's instructions regarding medicine use. There are medicines that are either safe or unsafe to take during pregnancy.   Exercise only as directed by your caregiver. Experiencing uterine cramps is a good sign to stop exercising.   Continue to eat regular, healthy meals.   Wear a good support bra for breast tenderness.   Do not use hot tubs, steam rooms, or saunas.   Wear your seat belt at all times when driving.   Avoid raw meat, uncooked cheese, cat litter boxes, and soil used by cats. These carry germs that can cause birth defects in the baby.   Take your prenatal vitamins.   Try taking a stool softener (if your caregiver approves) if you develop constipation. Eat more high-fiber foods, such as fresh vegetables or fruit and whole grains. Drink plenty of fluids to keep your urine clear or pale yellow.   Take warm sitz baths to soothe any pain or discomfort caused by hemorrhoids. Use hemorrhoid cream if your caregiver approves.   If you develop varicose veins, wear support hose. Elevate your feet for 15 minutes, 3 4 times a day. Limit salt in your diet.   Avoid heavy lifting, wear low heal shoes, and practice good posture.   Rest a lot with your legs elevated if you have leg cramps or low back pain.   Visit your dentist if you have not gone during your pregnancy. Use a soft toothbrush to brush your teeth and be gentle when you floss.   A sexual relationship may be continued unless your caregiver directs you otherwise.   Do not travel far distances unless it is absolutely necessary and only with the approval of your caregiver.   Take prenatal classes to understand, practice, and ask questions about the labor and delivery.   Make a trial run to the hospital.   Pack your hospital bag.   Prepare the baby's nursery.   Continue to go to all your prenatal visits as directed  by your caregiver.  SEEK MEDICAL CARE IF:   You are unsure if you are in labor or if your water has broken.   You have dizziness.   You have mild pelvic cramps, pelvic pressure, or nagging pain in your abdominal area.   You have persistent nausea, vomiting, or diarrhea.   You have a bad smelling vaginal discharge.   You have pain with urination.  SEEK IMMEDIATE MEDICAL CARE IF:    You have a fever.   You are leaking fluid from your vagina.   You have spotting or bleeding from your vagina.     You have severe abdominal cramping or pain.   You have rapid weight loss or gain.   You have shortness of breath with chest pain.   You notice sudden or extreme swelling of your face, hands, ankles, feet, or legs.   You have not felt your baby move in over an hour.   You have severe headaches that do not go away with medicine.   You have vision changes.  Document Released: 12/05/2001 Document Revised: 08/13/2013 Document Reviewed: 02/11/2013  [employer] Patient Information 2014 [employer].

## 2013-12-29 NOTE — Progress Notes (Signed)
Reports good fm. Denies uc's, lof, vb, urinary frequency, urgency, hesitancy, or dysuria.  No complaints.  1hr value missing from 2hr gtt, discussed w/ Orlie PollenLynne, too late to retrieve, pt would have to repeat 2hr gtt. Fasting and 2hr levels were completely normal, pt has no h/o GDM, will not repeat. Reviewed ptl s/s, fkc.  All questions answered. Needs f/u u/s to check placentation, was low-lying previously. F/U asap for u/s, then 3wks for visit.

## 2014-01-02 ENCOUNTER — Ambulatory Visit (INDEPENDENT_AMBULATORY_CARE_PROVIDER_SITE_OTHER): Payer: Medicaid Other

## 2014-01-02 ENCOUNTER — Other Ambulatory Visit: Payer: Self-pay | Admitting: Obstetrics & Gynecology

## 2014-01-02 DIAGNOSIS — O4402 Placenta previa specified as without hemorrhage, second trimester: Secondary | ICD-10-CM

## 2014-01-02 DIAGNOSIS — O4410 Placenta previa with hemorrhage, unspecified trimester: Secondary | ICD-10-CM

## 2014-01-02 DIAGNOSIS — O4400 Placenta previa specified as without hemorrhage, unspecified trimester: Secondary | ICD-10-CM

## 2014-01-02 DIAGNOSIS — O4413 Placenta previa with hemorrhage, third trimester: Secondary | ICD-10-CM

## 2014-01-02 NOTE — Progress Notes (Signed)
U/S(30+0wks)-active fetus, vtx presetnation, anterior Gr 1 placenta, fluid wnl ,EFW 3 lb 12 oz (67th%tile), Cx= 4.3cm(measured vaginally), FHR-148bpm, anterior placenta tip measures 3.5cm from internal cx os(measured vaginally)

## 2014-01-20 ENCOUNTER — Encounter: Payer: Self-pay | Admitting: Advanced Practice Midwife

## 2014-01-20 ENCOUNTER — Ambulatory Visit (INDEPENDENT_AMBULATORY_CARE_PROVIDER_SITE_OTHER): Payer: Medicaid Other | Admitting: Advanced Practice Midwife

## 2014-01-20 VITALS — BP 132/66 | Wt 187.0 lb

## 2014-01-20 DIAGNOSIS — O4440 Low lying placenta NOS or without hemorrhage, unspecified trimester: Secondary | ICD-10-CM

## 2014-01-20 DIAGNOSIS — O99019 Anemia complicating pregnancy, unspecified trimester: Secondary | ICD-10-CM

## 2014-01-20 DIAGNOSIS — Z331 Pregnant state, incidental: Secondary | ICD-10-CM

## 2014-01-20 DIAGNOSIS — Z1389 Encounter for screening for other disorder: Secondary | ICD-10-CM

## 2014-01-20 LAB — POCT URINALYSIS DIPSTICK
Blood, UA: NEGATIVE
Glucose, UA: NEGATIVE
KETONES UA: NEGATIVE
LEUKOCYTES UA: NEGATIVE
Nitrite, UA: NEGATIVE
Protein, UA: NEGATIVE

## 2014-01-20 NOTE — Progress Notes (Signed)
No c/o at this time.  Routine questions about pregnancy answered.  F/U in 2 weeks for LROB.    

## 2014-02-03 ENCOUNTER — Encounter: Payer: Self-pay | Admitting: Advanced Practice Midwife

## 2014-02-03 ENCOUNTER — Ambulatory Visit (INDEPENDENT_AMBULATORY_CARE_PROVIDER_SITE_OTHER): Payer: Medicaid Other | Admitting: Advanced Practice Midwife

## 2014-02-03 VITALS — BP 110/64 | Wt 189.0 lb

## 2014-02-03 DIAGNOSIS — Z1389 Encounter for screening for other disorder: Secondary | ICD-10-CM

## 2014-02-03 DIAGNOSIS — O99019 Anemia complicating pregnancy, unspecified trimester: Secondary | ICD-10-CM

## 2014-02-03 DIAGNOSIS — Z331 Pregnant state, incidental: Secondary | ICD-10-CM

## 2014-02-03 LAB — POCT URINALYSIS DIPSTICK
Blood, UA: NEGATIVE
GLUCOSE UA: NEGATIVE
Ketones, UA: NEGATIVE
LEUKOCYTES UA: NEGATIVE
NITRITE UA: NEGATIVE

## 2014-02-03 NOTE — Progress Notes (Signed)
Normal pregnancy complaints. Routine questions about pregnancy answered.  F/U in 2 weeks for LROB.

## 2014-02-17 ENCOUNTER — Encounter: Payer: Medicaid Other | Admitting: Women's Health

## 2014-02-24 ENCOUNTER — Encounter: Payer: Self-pay | Admitting: Women's Health

## 2014-02-24 ENCOUNTER — Ambulatory Visit (INDEPENDENT_AMBULATORY_CARE_PROVIDER_SITE_OTHER): Payer: Medicaid Other | Admitting: Women's Health

## 2014-02-24 ENCOUNTER — Encounter (INDEPENDENT_AMBULATORY_CARE_PROVIDER_SITE_OTHER): Payer: Self-pay

## 2014-02-24 VITALS — BP 110/52 | Wt 189.5 lb

## 2014-02-24 DIAGNOSIS — Z331 Pregnant state, incidental: Secondary | ICD-10-CM

## 2014-02-24 DIAGNOSIS — Z1389 Encounter for screening for other disorder: Secondary | ICD-10-CM

## 2014-02-24 DIAGNOSIS — Z3480 Encounter for supervision of other normal pregnancy, unspecified trimester: Secondary | ICD-10-CM

## 2014-02-24 DIAGNOSIS — O368190 Decreased fetal movements, unspecified trimester, not applicable or unspecified: Secondary | ICD-10-CM

## 2014-02-24 LAB — POCT URINALYSIS DIPSTICK
Blood, UA: NEGATIVE
Ketones, UA: NEGATIVE
LEUKOCYTES UA: NEGATIVE
NITRITE UA: NEGATIVE
PROTEIN UA: NEGATIVE

## 2014-02-24 LAB — OB RESULTS CONSOLE GC/CHLAMYDIA
Chlamydia: NEGATIVE
GC PROBE AMP, GENITAL: NEGATIVE

## 2014-02-24 LAB — OB RESULTS CONSOLE GBS: GBS: NEGATIVE

## 2014-02-24 NOTE — Patient Instructions (Signed)
Call the office or go to [facility] if:  You begin to have strong, frequent contractions  Your water breaks.  Sometimes it is a big gush of fluid, sometimes it is just a trickle that keeps getting your panties wet or running down your legs  You have vaginal bleeding.  It is normal to have a small amount of spotting if your cervix was checked.   You don't feel your baby moving like normal.  If you don't, get you something to eat and drink and lay down and focus on feeling your baby move.  You should feel at least 10 movements in 2 hours.  If you don't, you should call the office or go to [facility].   Braxton Hicks Contractions Pregnancy is commonly associated with contractions of the uterus throughout the pregnancy. Towards the end of pregnancy (32 to 34 weeks), these contractions (Braxton Hicks) can develop more often and may become more forceful. This is not true labor because these contractions do not result in opening (dilatation) and thinning of the cervix. They are sometimes difficult to tell apart from true labor because these contractions can be forceful and people have different pain tolerances. You should not feel embarrassed if you go to the hospital with false labor. Sometimes, the only way to tell if you are in true labor is for your caregiver to follow the changes in the cervix. How to tell the difference between true and false labor:  False labor.  The contractions of false labor are usually shorter, irregular and not as hard as those of true labor.  They are often felt in the front of the lower abdomen and in the groin.  They may leave with walking around or changing positions while lying down.  They get weaker and are shorter lasting as time goes on.  These contractions are usually irregular.  They do not usually become progressively stronger, regular and closer together as with true labor.  True labor.  Contractions in true labor last 30 to 70 seconds,  become very regular, usually become more intense, and increase in frequency.  They do not go away with walking.  The discomfort is usually felt in the top of the uterus and spreads to the lower abdomen and low back.  True labor can be determined by your caregiver with an exam. This will show that the cervix is dilating and getting thinner. If there are no prenatal problems or other health problems associated with the pregnancy, it is completely safe to be sent home with false labor and await the onset of true labor. HOME CARE INSTRUCTIONS   Keep up with your usual exercises and instructions.  Take medications as directed.  Keep your regular prenatal appointment.  Eat and drink lightly if you think you are going into labor.  If BH contractions are making you uncomfortable:  Change your activity position from lying down or resting to walking/walking to resting.  Sit and rest in a tub of warm water.  Drink 2 to 3 glasses of water. Dehydration may cause B-H contractions.  Do slow and deep breathing several times an hour. SEEK IMMEDIATE MEDICAL CARE IF:   Your contractions continue to become stronger, more regular, and closer together.  You have a gushing, burst or leaking of fluid from the vagina.  An oral temperature above 102 F (38.9 C) develops.  You have passage of blood-tinged mucus.  You develop vaginal bleeding.  You develop continuous belly (abdominal) pain.  You have   low back pain that you never had before.  You feel the baby's head pushing down causing pelvic pressure.  The baby is not moving as much as it used to. Document Released: 12/11/2005 Document Revised: 03/04/2012 Document Reviewed: 09/22/2013 [employer] Patient Information 2014 [employer].  

## 2014-02-24 NOTE — Progress Notes (Signed)
Reports decreased fm x 1 month. Reactive NST, much movement perceived by pt while on efm. Denies regular uc's, lof, vb, uti s/s.  Nausea, occ vomiting, diarrhea x ~4-5x/daily for last 2-3 days. Recommended increasing po hydration/gatorade, can try immodium to help w/ diarrhea if needed. Requests SVE: ft-tight 1/thick/-3, vtx.  Reviewed labor s/s, fkc.  All questions answered. F/U in 1wk for visit. GBS today.

## 2014-02-25 ENCOUNTER — Inpatient Hospital Stay (HOSPITAL_COMMUNITY)
Admission: AD | Admit: 2014-02-25 | Discharge: 2014-02-25 | Disposition: A | Discharge status: Home / Self Care | Payer: Medicaid Other | Source: Ambulatory Visit | Attending: Obstetrics & Gynecology | Admitting: Obstetrics & Gynecology

## 2014-02-25 ENCOUNTER — Encounter (HOSPITAL_COMMUNITY): Payer: Self-pay | Admitting: *Deleted

## 2014-02-25 DIAGNOSIS — Z87891 Personal history of nicotine dependence: Secondary | ICD-10-CM | POA: Insufficient documentation

## 2014-02-25 DIAGNOSIS — K219 Gastro-esophageal reflux disease without esophagitis: Secondary | ICD-10-CM | POA: Insufficient documentation

## 2014-02-25 DIAGNOSIS — O471 False labor at or after 37 completed weeks of gestation: Secondary | ICD-10-CM

## 2014-02-25 DIAGNOSIS — O9989 Other specified diseases and conditions complicating pregnancy, childbirth and the puerperium: Principal | ICD-10-CM

## 2014-02-25 DIAGNOSIS — N949 Unspecified condition associated with female genital organs and menstrual cycle: Secondary | ICD-10-CM | POA: Insufficient documentation

## 2014-02-25 DIAGNOSIS — R109 Unspecified abdominal pain: Secondary | ICD-10-CM | POA: Insufficient documentation

## 2014-02-25 DIAGNOSIS — O99891 Other specified diseases and conditions complicating pregnancy: Secondary | ICD-10-CM | POA: Insufficient documentation

## 2014-02-25 DIAGNOSIS — M549 Dorsalgia, unspecified: Secondary | ICD-10-CM | POA: Insufficient documentation

## 2014-02-25 LAB — URINALYSIS, ROUTINE W REFLEX MICROSCOPIC
Bilirubin Urine: NEGATIVE
GLUCOSE, UA: NEGATIVE mg/dL
HGB URINE DIPSTICK: NEGATIVE
KETONES UR: NEGATIVE mg/dL
Leukocytes, UA: NEGATIVE
Nitrite: NEGATIVE
PH: 7 (ref 5.0–8.0)
Protein, ur: NEGATIVE mg/dL
Specific Gravity, Urine: 1.02 (ref 1.005–1.030)
Urobilinogen, UA: 0.2 mg/dL (ref 0.0–1.0)

## 2014-02-25 LAB — GC/CHLAMYDIA PROBE AMP
CT Probe RNA: NEGATIVE
GC Probe RNA: NEGATIVE

## 2014-02-25 MED ORDER — ACETAMINOPHEN-CODEINE #3 300-30 MG PO TABS
1.0000 | ORAL_TABLET | Freq: Four times a day (QID) | ORAL | Status: DC | PRN
Start: 2014-02-25 — End: 2014-03-07

## 2014-02-25 NOTE — MAU Note (Signed)
Pt states she has had abdominal cramping and diarrhea for 3 days. Pt states no diarrhea today but she has back pain.

## 2014-02-25 NOTE — Discharge Instructions (Signed)
Abdominal Pain During Pregnancy Abdominal pain is common in pregnancy. Most of the time, it does not cause harm. There are many causes of abdominal pain. Some causes are more serious than others. Some of the causes of abdominal pain in pregnancy are easily diagnosed. Occasionally, the diagnosis takes time to understand. Other times, the cause is not determined. Abdominal pain can be a sign that something is very wrong with the pregnancy, or the pain may have nothing to do with the pregnancy at all. For this reason, always tell your health care provider if you have any abdominal discomfort. HOME CARE INSTRUCTIONS  Monitor your abdominal pain for any changes. The following actions may help to alleviate any discomfort you are experiencing:  Do not have sexual intercourse or put anything in your vagina until your symptoms go away completely.  Get plenty of rest until your pain improves.  Drink clear fluids if you feel nauseous. Avoid solid food as long as you are uncomfortable or nauseous.  Only take over-the-counter or prescription medicine as directed by your health care provider.  Keep all follow-up appointments with your health care provider. SEEK IMMEDIATE MEDICAL CARE IF:  You are bleeding, leaking fluid, or passing tissue from the vagina.  You have increasing pain or cramping.  You have persistent vomiting.  You have painful or bloody urination.  You have a fever.  You notice a decrease in your baby's movements.  You have extreme weakness or feel faint.  You have shortness of breath, with or without abdominal pain.  You develop a severe headache with abdominal pain.  You have abnormal vaginal discharge with abdominal pain.  You have persistent diarrhea.  You have abdominal pain that continues even after rest, or gets worse. MAKE SURE YOU:   Understand these instructions.  Will watch your condition.  Will get help right away if you are not doing well or get  worse. Document Released: 12/11/2005 Document Revised: 10/01/2013 Document Reviewed: 07/10/2013 [facility] Patient Information 2014 [place], [place].  Braxton Hicks Contractions Pregnancy is commonly associated with contractions of the uterus throughout the pregnancy. Towards the end of pregnancy (32 to 34 weeks), these contractions [facility](Braxton Willa Rough[place]icks) can develop more often and may become more forceful. This is not true labor because these contractions do not result in opening (dilatation) and thinning of the cervix. They are sometimes difficult to tell apart from true labor because these contractions can be forceful and people have different pain tolerances. You should not feel embarrassed if you go to the hospital with false labor. Sometimes, the only way to tell if you are in true labor is for your caregiver to follow the changes in the cervix. How to tell the difference between true and false labor:  False labor.  The contractions of false labor are usually shorter, irregular and not as hard as those of true labor.  They are often felt in the front of the lower abdomen and in the groin.  They may leave with walking around or changing positions while lying down.  They get weaker and are shorter lasting as time goes on.  These contractions are usually irregular.  They do not usually become progressively stronger, regular and closer together as with true labor.  True labor.  Contractions in true labor last 30 to 70 seconds, become very regular, usually become more intense, and increase in frequency.  They do not go away with walking.  The discomfort is usually felt in the top of the uterus and spreads  to the lower abdomen and low back.  True labor can be determined by your caregiver with an exam. This will show that the cervix is dilating and getting thinner. If there are no prenatal problems or other health problems associated with the pregnancy, it is completely safe to be sent home with  false labor and await the onset of true labor. HOME CARE INSTRUCTIONS   Keep up with your usual exercises and instructions.  Take medications as directed.  Keep your regular prenatal appointment.  Eat and drink lightly if you think you are going into labor.  If BH contractions are making you uncomfortable:  Change your activity position from lying down or resting to walking/walking to resting.  Sit and rest in a tub of warm water.  Drink 2 to 3 glasses of water. Dehydration may cause B-H contractions.  Do slow and deep breathing several times an hour. SEEK IMMEDIATE MEDICAL CARE IF:   Your contractions continue to become stronger, more regular, and closer together.  You have a gushing, burst or leaking of fluid from the vagina.  An oral temperature above 102 F (38.9 C) develops.  You have passage of blood-tinged mucus.  You develop vaginal bleeding.  You develop continuous belly (abdominal) pain.  You have low back pain that you never had before.  You feel the baby's head pushing down causing pelvic pressure.  The baby is not moving as much as it used to. Document Released: 12/11/2005 Document Revised: 03/04/2012 Document Reviewed: 09/22/2013 [facility]Exit[facility] Patient Information 2014 [place], [place].

## 2014-02-25 NOTE — MAU Provider Note (Signed)
  History    28 yr G2P1001 at 5837 w 5d who called [facility] from work due to progressive lower abdominal and back discomfort, advised to come to MAU for assessment. Pt called office during lunch hour. NO SROM OR BLEEDING. Since arrival pt has been examined, cervix is 0.5 cm, and no evidence of srom. Pt Shows FHR Cat I ,and, no evidence of contractions on Ext tocodynamometer. CSN: [id number]  Arrival date and time: 02/25/14 1633   None     Chief Complaint  Patient presents with  . Back Pain   Back Pain Pertinent negatives include no dysuria.   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 Pt unable to complete full shifts without discomfort.   Past Medical History  Diagnosis Date  . Depression   . History of abnormal Pap smear   . 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 Pregnant 07/22/2013  . GERD (gastroesophageal reflux disease)     Past Surgical History  Procedure Laterality Date  . Leep    . Dental examination under anesthesia      Family History  Problem Relation Age of Onset  . Hypertension Other   . Cancer Other   . Heart disease Paternal Grandfather   . Diabetes Mother     History  Substance Use Topics  . Smoking status: Former Smoker    Types: Cigarettes  . Smokeless tobacco: Never Used     Comment: 1 pk can last up to 2 weeks.  . Alcohol Use: No    Allergies:  Allergies  Allergen Reactions  . Amoxicillin Hives    Prescriptions prior to admission  Medication Sig Dispense Refill  . acetaminophen (TYLENOL) 500 MG tablet Take 500 mg by mouth as needed for headache.       . calcium carbonate (TUMS - DOSED IN MG ELEMENTAL CALCIUM) 500 MG chewable tablet Chew 4 tablets by mouth daily.      Marland Kitchen. Dextromethorphan-Guaifenesin (ROBITUSSIN DM PO) Take 6 mLs by mouth as needed (cold).       . famotidine (PEPCID) 10 MG tablet Take 10 mg by mouth as needed for heartburn or indigestion.       . Prenatal Vit-Fe Fumarate-FA (PRENATAL MULTIVITAMIN) TABS tablet Take 1  tablet by mouth daily at 12 noon.      . pseudoephedrine (SUDAFED) 30 MG tablet Take 30 mg by mouth every 4 (four) hours as needed for congestion.        Review of Systems  Gastrointestinal: Negative.   Genitourinary: Negative for dysuria, urgency and frequency.  Musculoskeletal: Positive for back pain.   Physical Exam   Last menstrual period 05/24/2013.  Physical Exam  Constitutional: She is oriented to person, place, and time. She appears well-developed and well-nourished.  HENT:  Head: Normocephalic.  Eyes: Pupils are equal, round, and reactive to light.  Neck: Neck supple.  Cardiovascular: Normal rate and regular rhythm.   Genitourinary: Vagina normal and uterus normal.  cx 0.5/long by RN  Musculoskeletal: Normal range of motion.  Neurological: She is alert and oriented to person, place, and time. She has normal reflexes.    MAU Course  Procedures  MDM   Assessment and Plan  Pregnancy 5328w5d, Pelvic pressure No evidence of labor. Plan: D/c home followup next Week [facility].  Megan Contreras 02/25/2014, 6:41 PM

## 2014-02-26 LAB — CULTURE, STREPTOCOCCUS GRP B W/SUSCEPT

## 2014-03-02 ENCOUNTER — Encounter: Payer: Self-pay | Admitting: Women's Health

## 2014-03-03 ENCOUNTER — Encounter: Payer: Self-pay | Admitting: Obstetrics & Gynecology

## 2014-03-03 ENCOUNTER — Ambulatory Visit (INDEPENDENT_AMBULATORY_CARE_PROVIDER_SITE_OTHER): Payer: Medicaid Other | Admitting: Obstetrics & Gynecology

## 2014-03-03 VITALS — BP 120/80 | Wt 191.0 lb

## 2014-03-03 DIAGNOSIS — Z331 Pregnant state, incidental: Secondary | ICD-10-CM

## 2014-03-03 DIAGNOSIS — O99019 Anemia complicating pregnancy, unspecified trimester: Secondary | ICD-10-CM

## 2014-03-03 DIAGNOSIS — Z1389 Encounter for screening for other disorder: Secondary | ICD-10-CM

## 2014-03-03 LAB — POCT URINALYSIS DIPSTICK
Blood, UA: NEGATIVE
Glucose, UA: NEGATIVE
KETONES UA: NEGATIVE
LEUKOCYTES UA: NEGATIVE
Nitrite, UA: NEGATIVE
Protein, UA: NEGATIVE

## 2014-03-03 NOTE — Progress Notes (Signed)
BP weight and urine results all reviewed and noted. Patient reports good fetal movement, denies any bleeding and no rupture of membranes symptoms or regular contractions. Patient is without complaints. All questions were answered.  

## 2014-03-05 ENCOUNTER — Encounter (HOSPITAL_COMMUNITY): Payer: Self-pay | Admitting: *Deleted

## 2014-03-05 ENCOUNTER — Inpatient Hospital Stay (HOSPITAL_COMMUNITY): Payer: Medicaid Other | Admitting: Anesthesiology

## 2014-03-05 ENCOUNTER — Inpatient Hospital Stay (HOSPITAL_COMMUNITY)
Admission: AD | Admit: 2014-03-05 | Discharge: 2014-03-07 | DRG: 775 | Disposition: A | Discharge status: Home / Self Care | Payer: Medicaid Other | Source: Ambulatory Visit | Attending: Family Medicine | Admitting: Family Medicine

## 2014-03-05 ENCOUNTER — Encounter (HOSPITAL_COMMUNITY): Payer: Medicaid Other | Admitting: Anesthesiology

## 2014-03-05 DIAGNOSIS — Z3480 Encounter for supervision of other normal pregnancy, unspecified trimester: Secondary | ICD-10-CM

## 2014-03-05 DIAGNOSIS — K219 Gastro-esophageal reflux disease without esophagitis: Secondary | ICD-10-CM | POA: Diagnosis present

## 2014-03-05 DIAGNOSIS — O358XX0 Maternal care for other (suspected) fetal abnormality and damage, not applicable or unspecified: Secondary | ICD-10-CM | POA: Diagnosis present

## 2014-03-05 DIAGNOSIS — Z87891 Personal history of nicotine dependence: Secondary | ICD-10-CM

## 2014-03-05 DIAGNOSIS — O98219 Gonorrhea complicating pregnancy, unspecified trimester: Secondary | ICD-10-CM

## 2014-03-05 DIAGNOSIS — O4290 Premature rupture of membranes, unspecified as to length of time between rupture and onset of labor, unspecified weeks of gestation: Principal | ICD-10-CM | POA: Diagnosis present

## 2014-03-05 DIAGNOSIS — O4440 Low lying placenta NOS or without hemorrhage, unspecified trimester: Secondary | ICD-10-CM

## 2014-03-05 LAB — CBC
HCT: 33.5 % — ABNORMAL LOW (ref 36.0–46.0)
Hemoglobin: 11.2 g/dL — ABNORMAL LOW (ref 12.0–15.0)
MCH: 28.6 pg (ref 26.0–34.0)
MCHC: 33.4 g/dL (ref 30.0–36.0)
MCV: 85.7 fL (ref 78.0–100.0)
PLATELETS: 282 10*3/uL (ref 150–400)
RBC: 3.91 MIL/uL (ref 3.87–5.11)
RDW: 14.4 % (ref 11.5–15.5)
WBC: 20 10*3/uL — AB (ref 4.0–10.5)

## 2014-03-05 LAB — ABO/RH: ABO/RH(D): O POS

## 2014-03-05 LAB — TYPE AND SCREEN
ABO/RH(D): O POS
Antibody Screen: NEGATIVE

## 2014-03-05 MED ORDER — PHENYLEPHRINE 40 MCG/ML (10ML) SYRINGE FOR IV PUSH (FOR BLOOD PRESSURE SUPPORT)
80.0000 ug | PREFILLED_SYRINGE | INTRAVENOUS | Status: DC | PRN
  Filled 2014-03-05: qty 10
  Filled 2014-03-05: qty 2

## 2014-03-05 MED ORDER — ACETAMINOPHEN 325 MG PO TABS
650.0000 mg | ORAL_TABLET | ORAL | Status: DC | PRN
  Administered 2014-03-06: 650 mg via ORAL
  Filled 2014-03-05: qty 2

## 2014-03-05 MED ORDER — FENTANYL CITRATE 0.05 MG/ML IJ SOLN
INTRAMUSCULAR | Status: AC
  Administered 2014-03-05: 50 ug via INTRAVENOUS
  Filled 2014-03-05: qty 2

## 2014-03-05 MED ORDER — DIPHENHYDRAMINE HCL 50 MG/ML IJ SOLN: 12.5000 mg | INTRAMUSCULAR | Status: DC | PRN

## 2014-03-05 MED ORDER — FENTANYL 2.5 MCG/ML BUPIVACAINE 1/10 % EPIDURAL INFUSION (WH - ANES)
14.0000 mL/h | INTRAMUSCULAR | Status: DC | PRN
  Administered 2014-03-06: 14 mL/h via EPIDURAL
  Filled 2014-03-05 (×2): qty 125

## 2014-03-05 MED ORDER — OXYCODONE-ACETAMINOPHEN 5-325 MG PO TABS
1.0000 | ORAL_TABLET | ORAL | Status: DC | PRN
  Administered 2014-03-06: 1 via ORAL
  Filled 2014-03-05: qty 1

## 2014-03-05 MED ORDER — PHENYLEPHRINE 40 MCG/ML (10ML) SYRINGE FOR IV PUSH (FOR BLOOD PRESSURE SUPPORT)
80.0000 ug | PREFILLED_SYRINGE | INTRAVENOUS | Status: DC | PRN
  Filled 2014-03-05: qty 2

## 2014-03-05 MED ORDER — EPHEDRINE 5 MG/ML INJ
10.0000 mg | INTRAVENOUS | Status: DC | PRN
  Filled 2014-03-05: qty 4
  Filled 2014-03-05: qty 2

## 2014-03-05 MED ORDER — IBUPROFEN 600 MG PO TABS
600.0000 mg | ORAL_TABLET | Freq: Four times a day (QID) | ORAL | Status: DC | PRN
  Administered 2014-03-06: 600 mg via ORAL
  Filled 2014-03-05: qty 1

## 2014-03-05 MED ORDER — LIDOCAINE HCL (PF) 1 % IJ SOLN
30.0000 mL | INTRAMUSCULAR | Status: DC | PRN
  Filled 2014-03-05 (×2): qty 30

## 2014-03-05 MED ORDER — FENTANYL 2.5 MCG/ML BUPIVACAINE 1/10 % EPIDURAL INFUSION (WH - ANES)
INTRAMUSCULAR | Status: DC | PRN
  Administered 2014-03-05: 14 mL/h via EPIDURAL

## 2014-03-05 MED ORDER — LACTATED RINGERS IV SOLN
500.0000 mL | Freq: Once | INTRAVENOUS | Status: AC
  Administered 2014-03-05: 500 mL via INTRAVENOUS

## 2014-03-05 MED ORDER — MISOPROSTOL 200 MCG PO TABS: 50.0000 ug | ORAL_TABLET | ORAL | Status: DC | PRN

## 2014-03-05 MED ORDER — LACTATED RINGERS IV SOLN
INTRAVENOUS | Status: DC
  Administered 2014-03-05 – 2014-03-06 (×3): via INTRAVENOUS

## 2014-03-05 MED ORDER — FENTANYL CITRATE 0.05 MG/ML IJ SOLN
100.0000 ug | INTRAMUSCULAR | Status: DC | PRN
  Administered 2014-03-05 (×2): 100 ug via INTRAVENOUS
  Filled 2014-03-05 (×2): qty 2

## 2014-03-05 MED ORDER — LIDOCAINE HCL (PF) 1 % IJ SOLN
INTRAMUSCULAR | Status: DC | PRN
Start: 2014-03-05 — End: 2014-03-06
  Administered 2014-03-05 (×2): 4 mL

## 2014-03-05 MED ORDER — EPHEDRINE 5 MG/ML INJ
10.0000 mg | INTRAVENOUS | Status: DC | PRN
  Filled 2014-03-05: qty 2

## 2014-03-05 MED ORDER — LACTATED RINGERS IV SOLN
500.0000 mL | INTRAVENOUS | Status: DC | PRN
  Administered 2014-03-06 (×2): 300 mL via INTRAVENOUS

## 2014-03-05 MED ORDER — ONDANSETRON HCL 4 MG/2ML IJ SOLN
4.0000 mg | Freq: Four times a day (QID) | INTRAMUSCULAR | Status: DC | PRN
  Administered 2014-03-05: 4 mg via INTRAVENOUS
  Filled 2014-03-05: qty 2

## 2014-03-05 MED ORDER — TERBUTALINE SULFATE 1 MG/ML IJ SOLN: 0.2500 mg | Freq: Once | INTRAMUSCULAR | Status: AC | PRN

## 2014-03-05 MED ORDER — OXYTOCIN BOLUS FROM INFUSION
500.0000 mL | INTRAVENOUS | Status: DC
  Administered 2014-03-06: 500 mL via INTRAVENOUS

## 2014-03-05 MED ORDER — OXYTOCIN 40 UNITS IN LACTATED RINGERS INFUSION - SIMPLE MED
62.5000 mL/h | INTRAVENOUS | Status: DC
  Filled 2014-03-05: qty 1000

## 2014-03-05 MED ORDER — CITRIC ACID-SODIUM CITRATE 334-500 MG/5ML PO SOLN
30.0000 mL | ORAL | Status: DC | PRN
  Administered 2014-03-06: 30 mL via ORAL
  Filled 2014-03-05: qty 15

## 2014-03-05 MED ORDER — FENTANYL CITRATE 0.05 MG/ML IJ SOLN
50.0000 ug | INTRAMUSCULAR | Status: DC | PRN
  Administered 2014-03-05: 50 ug via INTRAVENOUS
  Filled 2014-03-05: qty 2

## 2014-03-05 MED ORDER — FENTANYL CITRATE 0.05 MG/ML IJ SOLN
50.0000 ug | Freq: Once | INTRAMUSCULAR | Status: AC
  Administered 2014-03-05: 50 ug via INTRAVENOUS

## 2014-03-05 NOTE — Anesthesia Procedure Notes (Signed)
Epidural Patient location during procedure: OB Start time: 03/05/2014 11:44 PM  Staffing Anesthesiologist: Charle Mclaurin A. Performed by: anesthesiologist   Preanesthetic Checklist Completed: patient identified, site marked, surgical consent, pre-op evaluation, timeout performed, IV checked, risks and benefits discussed and monitors and equipment checked  Epidural Patient position: sitting Prep: site prepped and draped and DuraPrep Patient monitoring: continuous pulse ox and blood pressure Approach: midline Location: L3-L4 Injection technique: LOR air  Needle:  Needle type: Tuohy  Needle gauge: 17 G Needle length: 9 cm and 9 Needle insertion depth: 4 cm Catheter type: closed end flexible Catheter size: 19 Gauge Catheter at skin depth: 9 cm Test dose: negative and Other  Assessment Events: blood not aspirated, injection not painful, no injection resistance, negative IV test and no paresthesia  Additional Notes Patient identified. Risks and benefits discussed including failed block, incomplete  Pain control, post dural puncture headache, nerve damage, paralysis, blood pressure Changes, nausea, vomiting, reactions to medications-both toxic and allergic and post Partum back pain. All questions were answered. Patient expressed understanding and wished to proceed. Sterile technique was used throughout procedure. Epidural site was Dressed with sterile barrier dressing. No paresthesias, signs of intravascular injection Or signs of intrathecal spread were encountered.  Patient was more comfortable after the epidural was dosed. Please see RN's note for documentation of vital signs and FHR which are stable.

## 2014-03-05 NOTE — H&P (Signed)
Megan LewandowskyKristen G Lambert is a 28 y.o. female G2P1001 with IUP at 8611w6d presenting for evaluation of rupture of membranes. Pt reports fluid running down her leg at 11am today. Since then pt reports pink discharge that has been constant. Normal fetal movement, no BRB vaginal bleeding, cramping lasting 30 sec ever 4-1105min PNCare at [facility] since 6 wks  Prenatal History/Complications: Hx of LEEP Hx of MDD Hx of low lying placenta that resolved on repeat US on 01/02/14 with placenta GRADE 1(anterior placenta tip measure 3.5cm (vaginally) from internal cx os) Infant at 67%til at 30wks  Past Medical History: Past Medical History  Diagnosis Date  . Depression   . History of abnormal Pap smear   . 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 Pregnant 07/22/2013  . GERD (gastroesophageal reflux disease)     Past Surgical History: Past Surgical History  Procedure Laterality Date  . Leep    . Dental examination under anesthesia      Obstetrical History: OB History   Grav Para Term Preterm Abortions TAB SAB Ect Mult Living   2 1 1       1       Gynecological History: OB History   Grav Para Term Preterm Abortions TAB SAB Ect Mult Living   2 1 1       1       Social History: History   Social History  . Marital Status: Single    Spouse Name: N/A    Number of Children: N/A  . Years of Education: N/A   Social History Main Topics  . Smoking status: Former Smoker    Types: Cigarettes  . Smokeless tobacco: Never Used     Comment: 1 pk can last up to 2 weeks.  . Alcohol Use: No  . Drug Use: No  . Sexual Activity: Yes    Birth Control/ Protection: None   Other Topics Concern  . None   Social History Narrative  . None    Family History: Family History  Problem Relation Age of Onset  . Hypertension Other   . Cancer Other   . Heart disease Paternal Grandfather   . Diabetes Mother     Allergies: Allergies  Allergen Reactions  . Amoxicillin Hives    Prescriptions prior to admission   Medication Sig Dispense Refill  . acetaminophen (TYLENOL) 500 MG tablet Take 1,000 mg by mouth 2 (two) times daily as needed for headache.       Marland Kitchen. acetaminophen-codeine (TYLENOL #3) 300-30 MG per tablet Take 1-2 tablets by mouth every 6 (six) hours as needed for moderate pain.  30 tablet  0  . calcium carbonate (TUMS - DOSED IN MG ELEMENTAL CALCIUM) 500 MG chewable tablet Chew 1 tablet by mouth 4 (four) times daily as needed for heartburn.       . Dextromethorphan-Guaifenesin (ROBITUSSIN DM PO) Take 6 mLs by mouth as needed (cold).       . famotidine (PEPCID) 10 MG tablet Take 10 mg by mouth daily as needed for heartburn or indigestion.       . Prenatal Vit-Fe Fumarate-FA (PRENATAL MULTIVITAMIN) TABS tablet Take 1 tablet by mouth daily at 12 noon.      . pseudoephedrine (SUDAFED) 30 MG tablet Take 30 mg by mouth every 4 (four) hours as needed for congestion.         Review of Systems   Constitutional: pt in usual state of health with no other complaints at this time  Blood pressure 135/89, pulse 96, temperature 98.3 F (36.8 C), temperature source Oral, resp. rate 16, height 5' 1.5" (1.562 m), weight 85.548 kg (188 lb 9.6 oz), last menstrual period 05/24/2013, SpO2 98.00%. General appearance: alert, cooperative, appears stated age and no distress Lungs: clear to auscultation bilaterally Heart: regular rate and rhythm Abdomen: soft, non-tender; bowel sounds normal Pelvic: adequate Extremities: Homans sign is negative, no sign of DVT  Presentation: cephalic Fetal monitoringBaseline: 130s bpm, Variability: Good {> 6 bpm), Accelerations: Reactive and Decelerations: Absent Uterine activity none seen on monitor  Dilation: 1 Effacement (%): Thick Station: -1;-2 Exam by:: K.Wilson,RN   Prenatal labs: ABO, Rh: O/POS/-- (07/29 1430) Antibody: NEG (12/15 0926) Rubella:   RPR: NON REAC (12/15 0926)  HBsAg: NEGATIVE (07/29 1430)  HIV: NON REACTIVE (12/15 0926)  GBS:   Neg   Clinic  [facility]  Pap neg  GC/CT Initial:   +/-    POC:  -/-         36+wks:    -/-  Genetic Screen NT/IT: neg  CF screen neg  Anatomic [place] Normal female, low lying placenta- recheck @ 28wks- resolved at 28wks  Flu vaccine 11/10/13  Glucose Screen  2 hr (1hr value missing, unable to retrieve): fasting 83, 2hr 109, since both normal and no h/o GDM, will not repeat 2hr gtt  GBS neg  Feed Preference breast  Contraception nexplanon  Circumcision Yes, if boy at [facility]  Childbirth Classes declined  Pediatrician TMPA     Prenatal Transfer Tool  Maternal Diabetes: No Genetic Screening: Normal Maternal Ultrasounds/Referrals: Normal Fetal Ultrasounds or other Referrals:  None Maternal Substance Abuse:  No Significant Maternal Medications:  None Significant Maternal Lab Results: Lab values include: Group B Strep negative     No results found for this or any previous visit (from the past 24 hour(s)).  Assessment: Megan Contreras is a 28 y.o. G2P1001 at [redacted]w[redacted]d by L=6 here for PROM #Labor: will start induction with cytotec PO #Pain: Desires IV pain meds and epidural #FWB: CAT I #ID:  GBS neg #MOF: Breast #MOC: Nexplanon #Circ:  [facility]  Tawana Scale 03/05/2014, 4:58 PM

## 2014-03-05 NOTE — Progress Notes (Addendum)
   Gala LewandowskyKristen G Lambert is a 28 y.o. G2P1001 at 73103w6d  admitted for PROM  Subjective: Contractions are getting stronger  Objective: BP 111/79  Pulse 100  Temp(Src) 98.3 F (36.8 C) (Oral)  Resp 18  Ht 5' 1.5" (1.562 m)  Wt 191 lb (86.637 kg)  BMI 35.51 kg/m2  SpO2 98%  LMP 05/24/2013    FHT:  FHR: 150 bpm, variability: moderate,  accelerations:  Present,  decelerations:  Absent UC:   Mild q 1-2 minutes SVE:   Dilation: 1.5 Effacement (%): 20 Station: -2 Exam by:: Cresenzo-Dishmon, CNM Foley placed and inflated with 60cc H20   Labs: Lab Results  Component Value Date   WBC 20.0* 03/05/2014   HGB 11.2* 03/05/2014   HCT 33.5* 03/05/2014   MCV 85.7 03/05/2014   PLT 282 03/05/2014    Assessment / Plan: PROM at term, ripening phase.  When foley falls out, if not in active labor, will start pitocin  Labor: early, ripening phase Fetal Wellbeing:  Category I Pain Control:  Labor support without medications Anticipated MOD:  NSVD  CRESENZO-DISHMAN,Azhar Yogi 03/05/2014, 6:03 PM

## 2014-03-05 NOTE — Progress Notes (Signed)
   Megan LewandowskyKristen G Contreras is a 28 y.o. G2P1001 at 731w6d  admitted for PROM  Subjective: Desires epidural  Objective: BP 113/57  Pulse 91  Temp(Src) 98 F (36.7 C) (Oral)  Resp 18  Ht 5' 1.5" (1.562 m)  Wt 86.637 kg (191 lb)  BMI 35.51 kg/m2  SpO2 98%  LMP 05/24/2013    FHT:  FHR: 140 bpm, variability: moderate,  accelerations:  Present,  decelerations:  Absent UC:   q 2-5 minutes, moderate CX: 4-5/50/-2; foley out Labs: Lab Results  Component Value Date   WBC 20.0* 03/05/2014   HGB 11.2* 03/05/2014   HCT 33.5* 03/05/2014   MCV 85.7 03/05/2014   PLT 282 03/05/2014    Assessment / Plan: Augmentation of labor, progressing well; will order epidural  Labor: Progressing normally Fetal Wellbeing:  Category I Pain Control:  Fentanyl Anticipated MOD:  NSVD  CRESENZO-DISHMAN,Warren Lindahl 03/05/2014, 10:54 PM

## 2014-03-05 NOTE — MAU Note (Signed)
Patient states she has been having frequent contractions. States she had clear fluid that leaked down her legs at 1200. States she has had 2 episodes of leaking on the way to the hospital. Has only felt one fetal movement today.

## 2014-03-05 NOTE — Anesthesia Preprocedure Evaluation (Signed)
Anesthesia Evaluation  Patient identified by MRN, date of birth, ID band Patient awake    Reviewed: Allergy & Precautions, H&P , Patient's Chart, lab work & pertinent test results  Airway Mallampati: III TM Distance: >3 FB Neck ROM: Full    Dental no notable dental hx. (+) Teeth Intact   Pulmonary former smoker,  breath sounds clear to auscultation  Pulmonary exam normal       Cardiovascular negative cardio ROS  Rhythm:Regular Rate:Normal     Neuro/Psych PSYCHIATRIC DISORDERS Depression negative neurological ROS     GI/Hepatic Neg liver ROS, GERD-  Medicated and Controlled,  Endo/Other  Obesity  Renal/GU negative Renal ROS  negative genitourinary   Musculoskeletal negative musculoskeletal ROS (+)   Abdominal (+) + obese,   Peds  Hematology negative hematology ROS (+)   Anesthesia Other Findings   Reproductive/Obstetrics (+) Pregnancy                           Anesthesia Physical Anesthesia Plan  ASA: II  Anesthesia Plan: Epidural   Post-op Pain Management:    Induction:   Airway Management Planned: Natural Airway  Additional Equipment:   Intra-op Plan:   Post-operative Plan:   Informed Consent: I have reviewed the patients History and Physical, chart, labs and discussed the procedure including the risks, benefits and alternatives for the proposed anesthesia with the patient or authorized representative who has indicated his/her understanding and acceptance.     Plan Discussed with: Anesthesiologist  Anesthesia Plan Comments:         Anesthesia Quick Evaluation

## 2014-03-05 NOTE — Progress Notes (Signed)
Cresenzo-Dishmon, CNM, notified that patient reported no improvement with pain after receiving 50 mcg fentanyl IV. Orders received to give an additional 50 mcg of fentanyl IV now.

## 2014-03-06 ENCOUNTER — Encounter (HOSPITAL_COMMUNITY): Payer: Self-pay

## 2014-03-06 DIAGNOSIS — K219 Gastro-esophageal reflux disease without esophagitis: Secondary | ICD-10-CM

## 2014-03-06 DIAGNOSIS — O4290 Premature rupture of membranes, unspecified as to length of time between rupture and onset of labor, unspecified weeks of gestation: Secondary | ICD-10-CM

## 2014-03-06 DIAGNOSIS — O358XX0 Maternal care for other (suspected) fetal abnormality and damage, not applicable or unspecified: Secondary | ICD-10-CM

## 2014-03-06 LAB — RPR: RPR Ser Ql: NONREACTIVE

## 2014-03-06 MED ORDER — IBUPROFEN 600 MG PO TABS
600.0000 mg | ORAL_TABLET | Freq: Four times a day (QID) | ORAL | Status: DC
  Administered 2014-03-06 – 2014-03-07 (×4): 600 mg via ORAL
  Filled 2014-03-06 (×4): qty 1

## 2014-03-06 MED ORDER — PRENATAL MULTIVITAMIN CH
1.0000 | ORAL_TABLET | Freq: Every day | ORAL | Status: DC
  Administered 2014-03-07: 1 via ORAL
  Filled 2014-03-06: qty 1

## 2014-03-06 MED ORDER — SENNOSIDES-DOCUSATE SODIUM 8.6-50 MG PO TABS
2.0000 | ORAL_TABLET | ORAL | Status: DC
  Administered 2014-03-06: 2 via ORAL
  Filled 2014-03-06: qty 2

## 2014-03-06 MED ORDER — ONDANSETRON HCL 4 MG/2ML IJ SOLN: 4.0000 mg | INTRAMUSCULAR | Status: DC | PRN

## 2014-03-06 MED ORDER — OXYCODONE-ACETAMINOPHEN 5-325 MG PO TABS
1.0000 | ORAL_TABLET | ORAL | Status: DC | PRN
  Administered 2014-03-06: 2 via ORAL
  Administered 2014-03-06: 1 via ORAL
  Administered 2014-03-07 (×3): 2 via ORAL
  Filled 2014-03-06: qty 2
  Filled 2014-03-06: qty 1
  Filled 2014-03-06 (×3): qty 2

## 2014-03-06 MED ORDER — SIMETHICONE 80 MG PO CHEW: 80.0000 mg | CHEWABLE_TABLET | ORAL | Status: DC | PRN

## 2014-03-06 MED ORDER — LANOLIN HYDROUS EX OINT: TOPICAL_OINTMENT | CUTANEOUS | Status: DC | PRN

## 2014-03-06 MED ORDER — BENZOCAINE-MENTHOL 20-0.5 % EX AERO
1.0000 "application " | INHALATION_SPRAY | CUTANEOUS | Status: DC | PRN
  Administered 2014-03-06: 1 via TOPICAL
  Filled 2014-03-06: qty 56

## 2014-03-06 MED ORDER — OXYTOCIN 40 UNITS IN LACTATED RINGERS INFUSION - SIMPLE MED
1.0000 m[IU]/min | INTRAVENOUS | Status: DC
  Administered 2014-03-06: 2 m[IU]/min via INTRAVENOUS

## 2014-03-06 MED ORDER — ZOLPIDEM TARTRATE 5 MG PO TABS: 5.0000 mg | ORAL_TABLET | Freq: Every evening | ORAL | Status: DC | PRN

## 2014-03-06 MED ORDER — DIBUCAINE 1 % RE OINT: 1.0000 "application " | TOPICAL_OINTMENT | RECTAL | Status: DC | PRN

## 2014-03-06 MED ORDER — WITCH HAZEL-GLYCERIN EX PADS: 1.0000 "application " | MEDICATED_PAD | CUTANEOUS | Status: DC | PRN

## 2014-03-06 MED ORDER — DIPHENHYDRAMINE HCL 25 MG PO CAPS: 25.0000 mg | ORAL_CAPSULE | Freq: Four times a day (QID) | ORAL | Status: DC | PRN

## 2014-03-06 MED ORDER — TETANUS-DIPHTH-ACELL PERTUSSIS 5-2.5-18.5 LF-MCG/0.5 IM SUSP: 0.5000 mL | Freq: Once | INTRAMUSCULAR | Status: DC

## 2014-03-06 MED ORDER — ONDANSETRON HCL 4 MG PO TABS: 4.0000 mg | ORAL_TABLET | ORAL | Status: DC | PRN

## 2014-03-06 NOTE — H&P (Signed)
Attestation of Attending Supervision of Advanced Practitioner (PA/CNM/NP): Evaluation and management procedures were performed by the Advanced Practitioner under my supervision and collaboration.  I have reviewed the Advanced Practitioner's note and chart, and I agree with the management and plan.  Jahki Witham, MD, FACOG Attending Obstetrician & Gynecologist Faculty Practice, [facility]  

## 2014-03-06 NOTE — Progress Notes (Signed)
   Gala LewandowskyKristen G Lambert is a 28 y.o. G2P1001 at 9611w0d  admitted for PROM  Subjective:  Comfortable with epidural, some itching on R abdomen. Not ready to push.   Objective: BP 121/66  Pulse 90  Temp(Src) 99.2 F (37.3 C) (Oral)  Resp 18  Ht 5' 1.5" (1.562 m)  Wt 86.637 kg (191 lb)  BMI 35.51 kg/m2  SpO2 98%  LMP 05/24/2013 Total I/O In: -  Out: 375 [Urine:375]  FHT:  FHR: 130 bpm, variability: moderate,  accelerations:  Present,  decelerations:  Absent UC:   regular, q 2 minutes SVE:   Dilation: 10 Effacement (%): 100 Station: 0;+1 Exam by:: Renaldo HarrisonGoss, RN Pitocin @ 12 mu/min  Labs: Lab Results  Component Value Date   WBC 20.0* 03/05/2014   HGB 11.2* 03/05/2014   HCT 33.5* 03/05/2014   MCV 85.7 03/05/2014   PLT 282 03/05/2014    Assessment / Plan: IOL for PROM;    Labor: progressing on pitocin, active management Fetal Wellbeing:  Category I Pain Control:  Epidural Anticipated MOD:  NSVD  Michaelene SongHall, Wah Sabic C 03/06/2014, 1:18 PM

## 2014-03-06 NOTE — Progress Notes (Signed)
   Megan LewandowskyKristen G Contreras is a 28 y.o. G2P1001 at [redacted]w[redacted]d  admitted for PROM  Subjective:  Comfortable with epidural Objective: BP 109/55  Pulse 89  Temp(Src) 97.8 F (36.6 C) (Axillary)  Resp 16  Ht 5' 1.5" (1.562 m)  Wt 86.637 kg (191 lb)  BMI 35.51 kg/m2  SpO2 98%  LMP 05/24/2013 Total I/O In: -  Out: 375 [Urine:375]  FHT:  FHR: 130 bpm, variability: moderate,  accelerations:  Present,  decelerations:  Absent UC:   irregular, every 2-4 minutes SVE:   Dilation: 3.5 Effacement (%): 60 Station: -2 Exam by:: T.Braimah, SMW Pitocin @ 10 mu/min  Labs: Lab Results  Component Value Date   WBC 20.0* 03/05/2014   HGB 11.2* 03/05/2014   HCT 33.5* 03/05/2014   MCV 85.7 03/05/2014   PLT 282 03/05/2014    Assessment / Plan: IOL for PROM, slow\ Continue to increase pit Labor: no Fetal Wellbeing:  Category I Pain Control:  Epidural Anticipated MOD:  NSVD  CRESENZO-DISHMAN,Woodfin Kiss 03/06/2014, 7:43 AM

## 2014-03-07 LAB — CBC
HEMATOCRIT: 32.6 % — AB (ref 36.0–46.0)
Hemoglobin: 10.4 g/dL — ABNORMAL LOW (ref 12.0–15.0)
MCH: 28.1 pg (ref 26.0–34.0)
MCHC: 31.9 g/dL (ref 30.0–36.0)
MCV: 88.1 fL (ref 78.0–100.0)
Platelets: 216 10*3/uL (ref 150–400)
RBC: 3.7 MIL/uL — AB (ref 3.87–5.11)
RDW: 14.5 % (ref 11.5–15.5)
WBC: 19.3 10*3/uL — AB (ref 4.0–10.5)

## 2014-03-07 MED ORDER — MEASLES, MUMPS & RUBELLA VAC ~~LOC~~ INJ
0.5000 mL | INJECTION | Freq: Once | SUBCUTANEOUS | Status: AC
  Administered 2014-03-07: 0.5 mL via SUBCUTANEOUS
  Filled 2014-03-07: qty 0.5

## 2014-03-07 MED ORDER — IBUPROFEN 600 MG PO TABS: 600.0000 mg | ORAL_TABLET | Freq: Four times a day (QID) | ORAL | Status: DC

## 2014-03-07 NOTE — Plan of Care (Signed)
Problem: Discharge Progression Outcomes Goal: Barriers To Progression Addressed/Resolved Outcome: Completed/Met Date Met:  03/07/14 Feeling more stable when she walks

## 2014-03-07 NOTE — Plan of Care (Signed)
Problem: Phase I Progression Outcomes Goal: Pain controlled with appropriate interventions Outcome: Completed/Met Date Met:  03/07/14 Good pain control on po Percocet and Motrin Goal: OOB as tolerated unless otherwise ordered Outcome: Completed/Met Date Met:  03/07/14 Walks in room and tolerates well Goal: Initial discharge plan identified Outcome: Completed/Met Date Met:  03/07/14 VSS Pain control Vaginal bleeding WNL Understands self care and f/u

## 2014-03-07 NOTE — Discharge Instructions (Signed)

## 2014-03-07 NOTE — Progress Notes (Signed)
Discharge instructions provided to patient at bedside.  Activity, medications, follow up appointments, when to call the doctor and community resources discussed.  No questions at this time.  Patient left unit in stable condition with all personal belongings accompanied by staff.  K. Lauryn Lizardi, RN------------------------  

## 2014-03-07 NOTE — Anesthesia Postprocedure Evaluation (Signed)
  Anesthesia Post-op Note  Patient: Megan LewandowskyKristen G Contreras  Procedure(s) Performed: * No procedures listed *  Patient Location: [facility]  Anesthesia Type:Epidural  Level of Consciousness: awake and alert   Airway and Oxygen Therapy: Patient Spontanous Breathing  Post-op Pain: mild  Post-op Assessment: Patient's Cardiovascular Status Stable, Respiratory Function Stable, No signs of Nausea or vomiting, Pain level controlled, No headache, No residual numbness and No residual motor weakness  Post-op Vital Signs: Reviewed and stable  Complications: No apparent anesthesia complications

## 2014-03-07 NOTE — Discharge Summary (Signed)
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 No significant calf/ankle edema.  Discharge Diagnoses: Term Pregnancy-delivered  Discharge Information: Date: 03/07/2014 Activity: pelvic rest Diet: routine Medications: PNV and Ibuprofen Condition: stable Instructions: refer to practice specific booklet Discharge to: home Follow-up Information   [facility] 5 weeks.   Specialty:  Obstetrics and Gynecology   Contact information:   [address] [place] [id number] [phone]      Newborn Data: Live born female  Birth Weight: 8 lb 2.5 oz (3700 g) APGAR: 1, 6  [facility]   Pt presented for PROM and was induced without difficulty with pitocin to deliver a liveborn baby female. 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  Mom's postpartum care was uncomplicated. She is pumping and wanting nexplanon for contraception.   Shadell Brenn L 03/07/2014, 8:58 AM

## 2014-03-07 NOTE — Progress Notes (Signed)
Clinical Social Work Department PSYCHOSOCIAL ASSESSMENT - MATERNAL/CHILD 03/07/2014  Patient:  Megan Contreras, Megan Contreras  Account Number:  [id number]  Admit Date:  03/05/2014  Megan Contreras Name:   Megan Contreras    Clinical Social Worker:  Megan Needham, LCSW   Date/Time:  03/07/2014 01:15 PM  Date Referred:  03/06/2014      Referred reason  Psychosocial assessment   Other referral source:    I:  FAMILY / Megan Contreras legal guardian:  Megan Contreras  Guardian - Name Guardian - Age Guardian - Address  Megan Contreras, Megan Contreras [address] [place] [address]  Megan Contreras  same as above   Other household support members/support persons Other support:    II  PSYCHOSOCIAL DATA Information Source:    Occupational hygienist Employment:   Both parents employed   Museum/gallery curator resources:  Megan Contreras If Megan Contreras:   Other  Megan Contreras / Grade:   Maternity Care Coordinator / Child Services Coordination / Early Interventions:  Cultural issues impacting care:    III  STRENGTHS Strengths  Understanding of illness  Home prepared for Child (including basic supplies)  Adequate Resources  Supportive family/friends   Strength comment:    IV  RISK FACTORS AND CURRENT PROBLEMS Current Problem:       V  SOCIAL WORK ASSESSMENT Acknowledged MD order for social work consult.   It was noted that FOB has 5 other children that he is trying to regain custody of.   Met with both parents.  They were pleasant and receptive to social work intervention.  3 of FOB's children were visiting during [place] visit.  Newborn was transferred to [place] [facility].  Mother states that FOB has been very emotional and she has been coping a little better than he is.  Father briefly mentioned that his boys are in [place] custody and he has been working towards getting custody of them.  Mother has one other child age 60 that is in her cared.  She reports no DSS involvement with her and her son.  Informed that she and  FOB cohabitate.  Mother reports extensive family support and state that they have been very supportive with helping care for her 26 year old, and offering a place to stay when she arrives in [place].  She plans to remain in [place] with newborn.  She seems knowledgeable about newborn's medical condition.  She reports hx of depression and states that she was in therapy between age 68 thru 68 and on medication which she reportedly stop taking when she became pregnant with her 26 year old.  She notes that she has learned how to cope with out the mediation and never restarted them after the pregnancy.  She reports no current symptoms of depression. She denies any hx of substance abuse.  She has transportation to [place] and hopes to be discharged today.  Informed that maternal grandfather and her step mother are currently with newborn at [place] [facility].  No acute social concerns related at this time.  Mother informed of social work Megan Contreras.

## 2014-03-09 ENCOUNTER — Telehealth: Payer: Self-pay | Admitting: Obstetrics and Gynecology

## 2014-03-10 ENCOUNTER — Encounter: Payer: Medicaid Other | Admitting: Advanced Practice Midwife

## 2014-03-12 NOTE — Telephone Encounter (Signed)
Phone call to pt: Her baby boy has [facility], and will require future surgeries. Pt reports that she is uncomfortable with perineal periurethral lacerations. [facility], pt made aware that she can be seen there for eval if desired or if she wishes to be seen here, she will call and we'll try to see her as her schedule permits.

## 2014-04-06 ENCOUNTER — Ambulatory Visit (INDEPENDENT_AMBULATORY_CARE_PROVIDER_SITE_OTHER): Payer: Medicaid Other | Admitting: Women's Health

## 2014-04-06 ENCOUNTER — Encounter: Payer: Self-pay | Admitting: Women's Health

## 2014-04-06 VITALS — BP 118/80 | Ht 61.0 in | Wt 167.0 lb

## 2014-04-06 DIAGNOSIS — R309 Painful micturition, unspecified: Secondary | ICD-10-CM

## 2014-04-06 DIAGNOSIS — K5900 Constipation, unspecified: Secondary | ICD-10-CM

## 2014-04-06 DIAGNOSIS — Z3480 Encounter for supervision of other normal pregnancy, unspecified trimester: Secondary | ICD-10-CM

## 2014-04-06 DIAGNOSIS — R300 Dysuria: Secondary | ICD-10-CM

## 2014-04-06 DIAGNOSIS — Z8759 Personal history of other complications of pregnancy, childbirth and the puerperium: Secondary | ICD-10-CM

## 2014-04-06 DIAGNOSIS — Z3009 Encounter for other general counseling and advice on contraception: Secondary | ICD-10-CM

## 2014-04-06 LAB — POCT URINALYSIS DIPSTICK
Glucose, UA: NEGATIVE
Ketones, UA: NEGATIVE
NITRITE UA: NEGATIVE
Protein, UA: NEGATIVE

## 2014-04-06 NOTE — Progress Notes (Signed)
Patient ID: Megan Contreras, female   DOB: August 02, 1986, 28 y.o.   MRN: [id number] Subjective:    Megan Contreras is a 28 y.o. 372P2002 Caucasian female who presents for a postpartum visit. She is 4 weeks postpartum following a spontaneous vaginal delivery at 39 gestational weeks. Anesthesia: epidural. Mild shoulder dystocia r/b McRobert's. Infant weighed 8lb 2.5oz. I have fully reviewed the prenatal and intrapartum course. Postpartum course has been complicated by infant's illness. 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 Has clot in leg from catheter, currently on Lovenox. Baby is feeding by breastmilk through NG tube. States her supply has decreased. He just came home from the hospital 2 days ago. Bleeding thin lochia. Bowel function is some constipation. Bladder function is some pain/pressure after urinating. Patient is not sexually active. Last sexual activity: before birth of child. Contraception method is none and wants nexplanon. Postpartum depression screening: positive. Score 12. Does not feel like it is PPD, feels like it is situational r/t infant's cardiac issues, feels she is coping well. No SI/HI/II.  Last pap 07/22/13 and was neg.  The following portions of the patient's history were reviewed and updated as appropriate: allergies, current medications, past medical history, past surgical history and problem list.  Review of Systems Pertinent items are noted in HPI.   Filed Vitals:   04/06/14 0930  BP: 118/80  Height: 5\' 1"  (1.549 m)  Weight: 167 lb (75.751 kg)    Objective:     General:  alert, cooperative and no distress   Breasts:  deferred, no complaints  Lungs: clear to auscultation bilaterally  Heart:  regular rate and rhythm  Abdomen: soft, nontender   Vulva: normal  Vagina:  normal vagina  Cervix:  closed  Corpus: Well-involuted  Adnexa:  Non-palpable  Rectal Exam: No hemorrhoids        Assessment:   Postpartum exam 4 wks s/p SVD w/ mild SD Infant w/ tetralogy of fallot Depression screening, pos, but feels is situational d/t infant's cardiac issues, coping well Contraception counseling  Some constipation Pain/pressure after voiding Decreased milk supply  Plan:   Contraception: plans for nexplanon asap, no sex until after placed Follow up in: ASAP for nexplanon Will send urine cx today Discussed ways to increase milk supply Info on constipation given [facility]  Megan [facility]reras CNM, [facility]-BC 04/06/2014 9:42 AM

## 2014-04-06 NOTE — Patient Instructions (Signed)
Tips To Increase Milk Supply  Lots of water! Enough so that when your urine is clear  Plenty of calories, if you're not getting enough calories, your milk supply can decrease  Breastfeed/pump often, every 2-3 hours x 20-4230mins  Fenugreek 3 pills 3 times a day  Mother's Milk Tea  Lactation cookies, google for the recipe  Real oatmeal  Constipation  Drink plenty of fluid, preferably water, throughout the day  Eat foods high in fiber such as fruits, vegetables, and grains  Exercise, such as walking, is a good way to keep your bowels regular  Drink warm fluids, especially warm prune juice, or decaf coffee  Eat a 1/2 cup of real oatmeal (not instant), 1/2 cup applesauce, and 1/2-1 cup warm prune juice every day  If needed, you may take Colace (docusate sodium) stool softener once or twice a day to help keep the stool soft. If you are pregnant, wait until you are out of your first trimester (12-14 weeks of pregnancy)  If you still are having problems with constipation, you may take Miralax once daily as needed to help keep your bowels regular.  If you are pregnant, wait until you are out of your first trimester (12-14 weeks of pregnancy)  Etonogestrel implant- Nexplanon What is this medicine? ETONOGESTREL (et oh noe JES trel) is a contraceptive (birth control) device. It is used to prevent pregnancy. It can be used for up to 3 years. This medicine may be used for other purposes; ask your health care provider or pharmacist if you have questions. COMMON BRAND NAME(S): Implanon, Nexplanon  What should I tell my health care provider before I take this medicine? They need to know if you have any of these conditions: -abnormal vaginal bleeding -blood vessel disease or blood clots -cancer of the breast, cervix, or liver -depression -diabetes -gallbladder disease -headaches -heart disease or recent heart attack -high blood pressure -high cholesterol -kidney disease -liver  disease -renal disease -seizures -tobacco smoker -an unusual or allergic reaction to etonogestrel, other hormones, anesthetics or antiseptics, medicines, foods, dyes, or preservatives -pregnant or trying to get pregnant -breast-feeding How should I use this medicine? This device is inserted just under the skin on the inner side of your upper arm by a health care professional. Talk to your pediatrician regarding the use of this medicine in children. Special care may be needed. Overdosage: If you think you've taken too much of this medicine contact a poison control center or emergency room at once. Overdosage: If you think you have taken too much of this medicine contact a poison control center or emergency room at once. NOTE: This medicine is only for you. Do not share this medicine with others. What if I miss a dose? This does not apply. What may interact with this medicine? Do not take this medicine with any of the following medications: -amprenavir -bosentan -fosamprenavir This medicine may also interact with the following medications: -barbiturate medicines for inducing sleep or treating seizures -certain medicines for fungal infections like ketoconazole and itraconazole -griseofulvin -medicines to treat seizures like carbamazepine, felbamate, oxcarbazepine, phenytoin, topiramate -modafinil -phenylbutazone -rifampin -some medicines to treat HIV infection like atazanavir, indinavir, lopinavir, nelfinavir, tipranavir, ritonavir -St. John's wort This list may not describe all possible interactions. Give your health care provider a list of all the medicines, herbs, non-prescription drugs, or dietary supplements you use. Also tell them if you smoke, drink alcohol, or use illegal drugs. Some items may interact with your medicine. What should I watch for  while using this medicine? This product does not protect you against HIV infection (AIDS) or other sexually transmitted diseases. You  should be able to feel the implant by pressing your fingertips over the skin where it was inserted. Tell your doctor if you cannot feel the implant. What side effects may I notice from receiving this medicine? Side effects that you should report to your doctor or health care professional as soon as possible: -allergic reactions like skin rash, itching or hives, swelling of the face, lips, or tongue -breast lumps -changes in vision -confusion, trouble speaking or understanding -dark urine -depressed mood -general ill feeling or flu-like symptoms -light-colored stools -loss of appetite, nausea -right upper belly pain -severe headaches -severe pain, swelling, or tenderness in the abdomen -shortness of breath, chest pain, swelling in a leg -signs of pregnancy -sudden numbness or weakness of the face, arm or leg -trouble walking, dizziness, loss of balance or coordination -unusual vaginal bleeding, discharge -unusually weak or tired -yellowing of the eyes or skin Side effects that usually do not require medical attention (Report these to your doctor or health care professional if they continue or are bothersome.): -acne -breast pain -changes in weight -cough -fever or chills -headache -irregular menstrual bleeding -itching, burning, and vaginal discharge -pain or difficulty passing urine -sore throat This list may not describe all possible side effects. Call your doctor for medical advice about side effects. You may report side effects to FDA at 1-800-FDA-1088. Where should I keep my medicine? This drug is given in a hospital or clinic and will not be stored at home. NOTE: This sheet is a summary. It may not cover all possible information. If you have questions about this medicine, talk to your doctor, pharmacist, or health care provider.  2014, Elsevier/Gold Standard. (2012-06-17 15:37:45)

## 2014-04-07 LAB — URINE CULTURE
Colony Count: NO GROWTH
ORGANISM ID, BACTERIA: NO GROWTH

## 2014-04-09 ENCOUNTER — Ambulatory Visit (INDEPENDENT_AMBULATORY_CARE_PROVIDER_SITE_OTHER): Payer: Medicaid Other | Admitting: Advanced Practice Midwife

## 2014-04-09 ENCOUNTER — Encounter: Payer: Self-pay | Admitting: Advanced Practice Midwife

## 2014-04-09 VITALS — BP 124/80 | Ht 62.0 in | Wt 171.0 lb

## 2014-04-09 DIAGNOSIS — Z30017 Encounter for initial prescription of implantable subdermal contraceptive: Secondary | ICD-10-CM

## 2014-04-09 DIAGNOSIS — Z3202 Encounter for pregnancy test, result negative: Secondary | ICD-10-CM

## 2014-04-09 LAB — POCT URINE PREGNANCY: Preg Test, Ur: NEGATIVE

## 2014-04-09 NOTE — Progress Notes (Signed)
Megan LewandowskyKristen G Contreras is a 28 y.o. year old Caucasian female here for Nexplanon insertion.  She has not been sexually active since the birth of her baby a month ago and her pregnancy test today was negative.  Risks/benefits/side effects of Nexplanon have been discussed and her questions have been answered.  Specifically, a failure rate of 12/998 has been reported, with an increased failure rate if pt takes St. John's Wort and/or antiseizure medicaitons.  Megan LewandowskyKristen G Contreras is aware of the common side effect of irregular bleeding, which the incidence of decreases over time.  Her left arm, approximatly 4 inches proximal from the elbow, was cleansed with alcohol and anesthetized with 2cc of 2% Lidocaine.  The area was cleansed again and the Nexplanon was inserted without difficulty.  A pressure bandage was applied.  Pt was instructed to remove pressure bandage in a few hours, and keep insertion site covered with a bandaid for 3 days.  Back up contraception was recommended for 2 weeks.  Follow-up scheduled PRN problems  Jacklyn ShellFrances Cresenzo-Dishmon 04/09/2014 9:08 AM

## 2014-04-17 ENCOUNTER — Ambulatory Visit (HOSPITAL_COMMUNITY)
Admission: RE | Admit: 2014-04-17 | Discharge: 2014-04-17 | Disposition: A | Discharge status: Home / Self Care | Payer: Medicaid Other | Source: Ambulatory Visit | Attending: Pediatrics | Admitting: Pediatrics

## 2014-04-17 NOTE — Lactation Note (Signed)
Adult Lactation Consultation Outpatient Visit Note  Patient Name: Megan Contreras                                          " Gardiner Barefootathaniel"                                                                                                             6 weeks today;         Date of Birth: 02-Jul-1986                                                            Weight today: 8-8.6, 3872 Gestational Age at Delivery: Unknown  Type of Delivery:   Breastfeeding History: Frequency of Breastfeeding: Mother has been breastfeeding every 2-3 hours Length of Feeding: 10 mins Voids: 10 Stools: 3-4 dark green  Supplementing / Method: Bottle Pumping:  Type of Pump:Medela PIS    Frequency:once daily  Volume:  4 ounces  Comments: Infant was scheduled for LC appt by Dr Rana SnareLowe. Infant was a NICU patient from birth. 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 [facility]. Infant has a feeding tube . Mother is giving infant a bottle with 80 ml of formula that is fortified to 27 calories. If infant refuses entire bottle ,mother then gavage feeds the remainder.  Mother states that she has not been able to post pump due to hectic schedule of Dr visits. She is aware of the need to pump.   Mother has a 647 yr old that she breastfed for 7 months. She also has 5 step sons that are in her home for care every week-end.    Mother has a long history of depression, PTSD and anxiety since she was 28 yrs old. She states is very teary every day. She has been treated with antidepressants and anxiety meds most of these years. She states she took Prozac last with after her last child. She has been on multiple medications, paxil, zoloft, and xanax. Mother also states that her husband doesn't like for her to take medicine.      Consultation Evaluation: observed mother placing infant on breast in cradle hold. Infant sustained latch for 12-15 mins and transferred 6 ml.   Dr Rana SnareLowe was phoned to inform of infants  weight and amt transferred from breast. She will follow up with patient on Monday.  Initial Feeding Assessment: Pre-feed ZOXWRU:0454Weight:3872 Post-feed UJWJXB:1478Weight:3878 Amount Transferred:6 ml Comments:  Additional Feeding Assessment:infant placed on alternate breast for 10- mins. Infant transferred 6 ml  Pre-feed GNFAOZ:3086Weight:3882 Post-feed VHQION:6295Weight:3888 Amount Transferred:6 ml Comments:  Additional Feeding Assessment: SNS was sat up and infant took 8 ml , allowed infant only 5-10 mins at  breast. Pre-feed ZOXWRU:0454Weight:3888 Post-feed UJWJXB:1478Weight:3896 Amount Transferred:8 ml Comments:  Total Breast milk Transferred this Visit: 20 ml Total Supplement Given: infant was then bottle fed and took 30 ml. Mother then gavage fed the remainder to total 80 ml  Additional Interventions:  Mother may offer comfort feeding at breast for no more than 10 mins. Per feeding Mother to offer bottle of 80 ml  Mother to gavage feed infant the remainder . Totaling  80 ml per feeding every 2-3 hours Advised mother to post pump 4-6 times daily for 20 mins.  Recommend that mother take a breastfeeding supplement Tips to produce more milk Mother to nap frequently Advised mother to ask for family or friend support in home Recommend that mother phone Dr Emelda FearFerguson to inform of anxiety levels and depression.   Follow-Up  PRN or as Dr Rana SnareLowe request     Richarda BladeSherry McCoy Kaya Pottenger 04/17/2014, 3:37 PM

## 2014-04-21 ENCOUNTER — Ambulatory Visit: Payer: Medicaid Other | Admitting: Women's Health

## 2014-04-23 ENCOUNTER — Encounter: Payer: Self-pay | Admitting: Advanced Practice Midwife

## 2014-04-23 ENCOUNTER — Ambulatory Visit (INDEPENDENT_AMBULATORY_CARE_PROVIDER_SITE_OTHER): Payer: Medicaid Other | Admitting: Advanced Practice Midwife

## 2014-04-23 VITALS — BP 128/88 | Ht 62.0 in | Wt 165.0 lb

## 2014-04-23 DIAGNOSIS — O99345 Other mental disorders complicating the puerperium: Secondary | ICD-10-CM

## 2014-04-23 DIAGNOSIS — F439 Reaction to severe stress, unspecified: Secondary | ICD-10-CM

## 2014-04-23 NOTE — Progress Notes (Signed)
Megan LewandowskyKristen G Contreras 28 y.o.  Filed Vitals:   04/23/14 1142  BP: 128/88   Past Medical History  Diagnosis Date  . Depression   . History of abnormal Pap smear   . 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 Pregnant 07/22/2013  . GERD (gastroesophageal reflux disease)    Past Surgical History  Procedure Laterality Date  . Leep    . Dental examination under anesthesia     family history includes Cancer in her other; Diabetes in her mother; Heart disease in her paternal grandfather; Hypertension in her other. Current outpatient prescriptions:ibuprofen (ADVIL,MOTRIN) 600 MG tablet, Take 1 tablet (600 mg total) by mouth every 6 (six) hours., Disp: 30 tablet, Rfl: 0  HPI: here because lactation nurse wanted her evaluated for ppdepression.  Baby came home from [facility] a few weeks ago following heart surgery.  She has help from her fiance and mom, but still has trouble relinquishing control of day to day activities.  Provides all the medical care to her newborn.  Feels overwhelmed, is moody at times.  Denies SI/HI.  Has had problems with depression in the past, but hasn't been on meds since she found out she was pregnant.  Isn't really interested in meds right now.  ASSESSMENT:  PPD vs normal feelings asso with caring for a high needs infant  PLAN:  Referral to [facility] made for talk therapy, possible meds

## 2014-10-26 ENCOUNTER — Encounter: Payer: Self-pay | Admitting: Advanced Practice Midwife

## 2016-11-29 ENCOUNTER — Ambulatory Visit (INDEPENDENT_AMBULATORY_CARE_PROVIDER_SITE_OTHER): Payer: Medicaid Other | Admitting: Advanced Practice Midwife

## 2016-11-29 ENCOUNTER — Encounter: Payer: Self-pay | Admitting: Advanced Practice Midwife

## 2016-11-29 VITALS — BP 138/72 | HR 76 | Wt 209.0 lb

## 2016-11-29 DIAGNOSIS — Z3046 Encounter for surveillance of implantable subdermal contraceptive: Secondary | ICD-10-CM

## 2016-11-29 DIAGNOSIS — Z3049 Encounter for surveillance of other contraceptives: Secondary | ICD-10-CM | POA: Diagnosis not present

## 2016-11-29 MED ORDER — NORETHIN-ETH ESTRAD-FE BIPHAS 1 MG-10 MCG / 10 MCG PO TABS: 1.0000 | ORAL_TABLET | Freq: Every day | ORAL | 11 refills | Status: DC

## 2016-11-29 NOTE — Progress Notes (Signed)
[facility] Visit  Patient name: Megan Contreras MRN [id number]  Date of birth: 1986/09/16  CC & HPI:  Megan Contreras is a 30 y.o. Caucasian female presenting today for nexplanon removal. Is separated from Husband, gained weight.  Wants COCs  Pertinent History Reviewed:  Medical & Surgical Hx:   Past Medical History:  Diagnosis Date  . Abnormal Pap smear    had HPV  . Depression   . GERD (gastroesophageal reflux disease)   . History of abnormal Pap smear   . Pregnant 07/22/2013   Past Surgical History:  Procedure Laterality Date  . DENTAL EXAMINATION UNDER ANESTHESIA    . LEEP     Family History  Problem Relation Age of Onset  . Heart disease Paternal Grandfather   . Diabetes Mother   . Hypertension Other   . Cancer Other     Current Outpatient Prescriptions:  .  ibuprofen (ADVIL,MOTRIN) 600 MG tablet, Take 1 tablet (600 mg total) by mouth every 6 (six) hours. (Patient not taking: Reported on 11/29/2016), Disp: 30 tablet, Rfl: 0 .  Norethindrone-Ethinyl Estradiol-Fe Biphas (LO LOESTRIN FE) 1 MG-10 MCG / 10 MCG tablet, Take 1 tablet by mouth daily., Disp: 1 Package, Rfl: 11 Social History: Reviewed -  reports that she has been smoking Cigarettes.  She has never used smokeless tobacco.  Review of Systems:   Constitutional: Negative for fever and chills Eyes: Negative for visual disturbances Respiratory: Negative for shortness of breath, dyspnea Cardiovascular: Negative for chest pain or palpitations  Gastrointestinal: Negative for vomiting, diarrhea and constipation; no abdominal pain Genitourinary: Negative for dysuria and urgency, vaginal irritation or itching Musculoskeletal: Negative for back pain, joint pain, myalgias  Neurological: Negative for dizziness and headaches    Objective Findings:    Physical Examination: General appearance - well appearing, and in no distress Mental status - alert, oriented to person, place, and time Chest:  Normal  respiratory effort Heart - normal rate and regular rhythm Abdomen:  Soft, nontender Pelvic: deferred Musculoskeletal:  Normal range of motion without pain Extremities:  No edema    No results found for this or any previous visit (from the past 24 hour(s)).    Assessment & Plan:  A:   Nexplanlon removal P:  Start Loloestrin   Return if symptoms worsen or fail to improve.  CRESENZO-DISHMAN,Jasiyah Paulding CNM 11/29/2016 2:02 PM

## 2016-11-30 ENCOUNTER — Encounter: Payer: Self-pay | Admitting: Family Medicine

## 2016-11-30 ENCOUNTER — Ambulatory Visit (INDEPENDENT_AMBULATORY_CARE_PROVIDER_SITE_OTHER): Payer: Medicaid Other | Admitting: Family Medicine

## 2016-11-30 VITALS — BP 110/78 | HR 88 | Temp 98.0°F | Resp 16 | Ht 62.0 in | Wt 207.1 lb

## 2016-11-30 DIAGNOSIS — Z7689 Persons encountering health services in other specified circumstances: Secondary | ICD-10-CM

## 2016-11-30 DIAGNOSIS — Z23 Encounter for immunization: Secondary | ICD-10-CM | POA: Diagnosis not present

## 2016-11-30 DIAGNOSIS — Z720 Tobacco use: Secondary | ICD-10-CM | POA: Diagnosis not present

## 2016-11-30 DIAGNOSIS — K219 Gastro-esophageal reflux disease without esophagitis: Secondary | ICD-10-CM | POA: Diagnosis not present

## 2016-11-30 DIAGNOSIS — Z6837 Body mass index (BMI) 37.0-37.9, adult: Secondary | ICD-10-CM

## 2016-11-30 DIAGNOSIS — E6609 Other obesity due to excess calories: Secondary | ICD-10-CM

## 2016-11-30 DIAGNOSIS — E669 Obesity, unspecified: Secondary | ICD-10-CM | POA: Insufficient documentation

## 2016-11-30 MED ORDER — OMEPRAZOLE 20 MG PO CPDR: 20.0000 mg | DELAYED_RELEASE_CAPSULE | Freq: Every day | ORAL | 11 refills | Status: DC

## 2016-11-30 NOTE — Patient Instructions (Addendum)
Try to walk every day that you are able  Take the omeprazole daily  Try claritin or zyrtec if the cold symptoms persist  See me in 2-3 months for follow up

## 2016-11-30 NOTE — Progress Notes (Signed)
Chief Complaint  Patient presents with  . Establish Care   HAS NOT SEEN A PCP IN YEARS  Up to date with GYN care Complains of GERD. Dry skin, a "cold" that has lasted 2 weeks No ongoing medical problems On no prescription medications  I have discussed the multiple health risks associated with cigarette smoking including, but not limited to, cardiovascular disease, lung disease and cancer.  I have strongly recommended that smoking be stopped.  I have reviewed the various methods of quitting including cold [place]urkey, classes, nicotine replacements and prescription medications.  I have offered assistance in this difficult process.  The patient is not interested in assistance at this time. One pack lasts 2 weeks.  Smokes due to "stress"    Patient Active Problem List   Diagnosis Date Noted  . Tobacco abuse 11/30/2016  . Obese 11/30/2016  . Stress at home 04/23/2014  . Depression 04/26/2013  . History of abnormal Pap smear 04/26/2013    Outpatient Encounter Prescriptions as of 11/30/2016  Medication Sig  . calcium carbonate (TUMS - DOSED IN MG ELEMENTAL CALCIUM) 500 MG chewable tablet Chew 1 tablet by mouth 2 (two) times daily.  Marland Kitchen. ibuprofen (ADVIL,MOTRIN) 600 MG tablet Take 1 tablet (600 mg total) by mouth every 6 (six) hours.  . Norethindrone-Ethinyl Estradiol-Fe Biphas (LO LOESTRIN FE) 1 MG-10 MCG / 10 MCG tablet Take 1 tablet by mouth daily.  Marland Kitchen. omeprazole (PRILOSEC) 20 MG capsule Take 1 capsule (20 mg total) by mouth daily.   No facility-administered encounter medications on file as of 11/30/2016.     Past Medical History:  Diagnosis Date  . Abnormal Pap smear    had HPV  . Depression   . GERD (gastroesophageal reflux disease)   . History of abnormal Pap smear   . Pregnant 07/22/2013    Past Surgical History:  Procedure Laterality Date  . DENTAL EXAMINATION UNDER ANESTHESIA    . LEEP      Social History   Social History  . Marital status: Legally Separated    Spouse  name: N/A  . Number of children: 2  . Years of education: 12   Occupational History  . 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 Smoking status: Current Some Day Smoker    Types: Cigarettes    Last attempt to quit: 05/25/2013  . Smokeless tobacco: Never Used     Comment: 1 pk can last up to 2 weeks.  . Alcohol use 1.2 oz/week    2 Glasses of wine per week  . Drug use: No  . Sexual activity: Not Currently    Birth control/ protection: Implant   Other Topics Concern  . 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 Heart disease Paternal Grandfather   . Diabetes Mother   . Depression Mother   . Drug abuse Mother   . Hyperlipidemia Mother   . Depression Father   . Heart disease Son     tetralogy of fallot  . Hypertension Other   . Cancer Other     Review of Systems  Constitutional: Negative for malaise/fatigue and weight loss.  HENT: Positive for congestion. Negative for hearing  loss.        Stuffy nose and clear PND  Eyes: Negative for blurred vision and redness.  Respiratory: Negative for cough and sputum production.   Cardiovascular: Negative for chest pain and leg swelling.  Gastrointestinal: Positive for abdominal pain and heartburn. Negative for vomiting.  Genitourinary: Negative for frequency and urgency.  Musculoskeletal: Negative for back pain and myalgias.  Skin: Positive for itching and rash.  Neurological: Negative for dizziness and headaches.  Endo/Heme/Allergies: Positive for environmental allergies.  Psychiatric/Behavioral: Negative for depression. The patient is not nervous/anxious.     BP 110/78 (BP Location: Right Arm, Patient Position: Sitting, Cuff Size: Normal)   Pulse 88   Temp 98 F  (36.7 C) (Oral)   Resp 16   Ht 5\' 2"  (1.575 m)   Wt 207 lb 1.9 oz (93.9 kg)   LMP 11/21/2016 (Exact Date)   SpO2 99%   BMI 37.88 kg/m   Physical Exam  Constitutional: She is oriented to person, place, and time. She appears well-developed and well-nourished. No distress.  HENT:  Head: Normocephalic and atraumatic.  Right Ear: External ear normal.  Left Ear: External ear normal.  Nose: Nose normal.  Mouth/Throat: Oropharynx is clear and moist.  Eyes: EOM are normal. Pupils are equal, round, and reactive to light.  Neck: Normal range of motion.  Cardiovascular: Normal rate, regular rhythm and normal heart sounds.   Pulmonary/Chest: Effort normal and breath sounds normal.  Abdominal: Soft. Bowel sounds are normal. There is no tenderness.  Musculoskeletal: Normal range of motion. She exhibits no edema.  Lymphadenopathy:    She has no cervical adenopathy.  Neurological: She is alert and oriented to person, place, and time.  Skin: Skin is dry.  Psychiatric: She has a normal mood and affect. Her behavior is normal. Thought content normal.    ASSESSMENT/PLAN:  1. Need for prophylactic vaccination and inoculation against influenza  - Flu Vaccine QUAD 36+ mos IM  2. Gastroesophageal reflux disease, esophagitis presence not specified   3. Tobacco abuse   4. Class 2 obesity due to excess calories without serious comorbidity with body mass index (BMI) of 37.0 to 37.9 in adult   5. Encounter to establish care with new doctor    Patient Instructions  Try to walk every day that you are able  Take the omeprazole daily  Try claritin or zyrtec if the cold symptoms persist  See me in 2-3 months for follow up   Eustace MooreYvonne Sue Nelson, MD

## 2017-01-03 ENCOUNTER — Encounter: Payer: Self-pay | Admitting: Advanced Practice Midwife

## 2017-01-03 ENCOUNTER — Other Ambulatory Visit (HOSPITAL_COMMUNITY)
Admission: RE | Admit: 2017-01-03 | Discharge: 2017-01-03 | Disposition: A | Discharge status: Home / Self Care | Payer: Medicaid Other | Source: Ambulatory Visit | Attending: Advanced Practice Midwife | Admitting: Advanced Practice Midwife

## 2017-01-03 ENCOUNTER — Ambulatory Visit (INDEPENDENT_AMBULATORY_CARE_PROVIDER_SITE_OTHER): Payer: Medicaid Other | Admitting: Advanced Practice Midwife

## 2017-01-03 VITALS — BP 146/98 | HR 86 | Ht 62.0 in | Wt 211.0 lb

## 2017-01-03 DIAGNOSIS — Z0000 Encounter for general adult medical examination without abnormal findings: Secondary | ICD-10-CM | POA: Diagnosis not present

## 2017-01-03 DIAGNOSIS — Z01411 Encounter for gynecological examination (general) (routine) with abnormal findings: Secondary | ICD-10-CM | POA: Diagnosis present

## 2017-01-03 DIAGNOSIS — Z01419 Encounter for gynecological examination (general) (routine) without abnormal findings: Secondary | ICD-10-CM

## 2017-01-03 DIAGNOSIS — Z1151 Encounter for screening for human papillomavirus (HPV): Secondary | ICD-10-CM | POA: Diagnosis present

## 2017-01-03 NOTE — Progress Notes (Signed)
Megan Contreras 31 y.o.  Vitals:   01/03/17 0937  BP: (!) 146/98  Pulse: 86    140/96  Filed Weights   01/03/17 0937  Weight: 211 lb (95.7 kg)    Past Medical History: Past Medical History:  Diagnosis Date  . Abnormal Pap smear    had HPV  . Depression   . GERD (gastroesophageal reflux disease)   . History of abnormal Pap smear   . Pregnant 07/22/2013    Past Surgical History: Past Surgical History:  Procedure Laterality Date  . DENTAL EXAMINATION UNDER ANESTHESIA    . LEEP      Family History: Family History  Problem Relation Age of Onset  . Heart disease Paternal Grandfather   . Diabetes Mother   . Depression Mother   . Drug abuse Mother   . Hyperlipidemia Mother   . Depression Father   . Heart disease Son     tetralogy of fallot  . Hypertension Other   . Cancer Other     Social History: Social History  Substance Use Topics  . Smoking status: Current Some Day Smoker    Types: Cigarettes    Last attempt to quit: 05/25/2013  . Smokeless tobacco: Never Used     Comment: 1 pk can last up to 2 weeks.  . Alcohol use 1.2 oz/week    2 Glasses of wine per week    Allergies:  Allergies  Allergen Reactions  . Amoxicillin Hives      Current Outpatient Prescriptions:  .  Norethindrone-Ethinyl Estradiol-Fe Biphas (LO LOESTRIN FE) 1 MG-10 MCG / 10 MCG tablet, Take 1 tablet by mouth daily., Disp: 1 Package, Rfl: 11 .  omeprazole (PRILOSEC) 20 MG capsule, Take 1 capsule (20 mg total) by mouth daily., Disp: 30 capsule, Rfl: 11 .  calcium carbonate (TUMS - DOSED IN MG ELEMENTAL CALCIUM) 500 MG chewable tablet, Chew 1 tablet by mouth 2 (two) times daily., Disp: , Rfl:  .  ibuprofen (ADVIL,MOTRIN) 600 MG tablet, Take 1 tablet (600 mg total) by mouth every 6 (six) hours. (Patient not taking: Reported on 01/03/2017), Disp: 30 tablet, Rfl: 0  History of Present Illness: Here for   Review of Systems   Patient denies any headaches, blurred vision, shortness of breath,  chest pain, abdominal pain, problems with bowel movements, urination. Not having sex w/anyone.  Hasn't started COCs yet.  Plans to start w/next period.  Sometimes has shooting pain in upper right breast--bra doesn't fit anymore since gaining weight.    Physical Exam: General:  Well developed, well nourished, no acute distress Skin:  Warm and dry Neck:  Midline trachea, normal thyroid Lungs; Clear to auscultation bilaterally Breast:  No dominant palpable mass, retraction, or nipple discharge Cardiovascular: Regular rate and rhythm Abdomen:  Soft, non tender, no hepatosplenomegaly Pelvic:  External genitalia is normal in appearance.  The vagina is normal in appearance.  The cervix is bulbous.  Uterus is felt to be normal size, shape, and contour.  No adnexal masses or tenderness noted. Exam limited by habitus Extremities:  No swelling or varicosities noted Psych:  No mood changes.     Impression: Normal GYN exam Elevated BP     Plan: f/u 3 months for BP check

## 2017-01-05 LAB — CYTOLOGY - PAP: HPV: DETECTED — AB

## 2017-01-09 ENCOUNTER — Encounter: Payer: Self-pay | Admitting: Advanced Practice Midwife

## 2017-03-01 ENCOUNTER — Encounter: Payer: Self-pay | Admitting: Family Medicine

## 2017-03-01 ENCOUNTER — Ambulatory Visit (INDEPENDENT_AMBULATORY_CARE_PROVIDER_SITE_OTHER): Payer: Medicaid Other | Admitting: Family Medicine

## 2017-03-01 VITALS — BP 128/78 | HR 112 | Temp 96.4°F | Resp 16 | Ht 62.0 in | Wt 205.1 lb

## 2017-03-01 DIAGNOSIS — Z720 Tobacco use: Secondary | ICD-10-CM | POA: Diagnosis not present

## 2017-03-01 DIAGNOSIS — E6609 Other obesity due to excess calories: Secondary | ICD-10-CM

## 2017-03-01 DIAGNOSIS — Z6837 Body mass index (BMI) 37.0-37.9, adult: Secondary | ICD-10-CM

## 2017-03-01 DIAGNOSIS — K219 Gastro-esophageal reflux disease without esophagitis: Secondary | ICD-10-CM | POA: Diagnosis not present

## 2017-03-01 MED ORDER — OMEPRAZOLE 20 MG PO CPDR: 20.0000 mg | DELAYED_RELEASE_CAPSULE | Freq: Every day | ORAL | 3 refills | Status: DC

## 2017-03-01 NOTE — Patient Instructions (Signed)
Congratulations on losing weight Walk every day that you are able  Call the GYN for a follow up  See me in six months

## 2017-03-01 NOTE — Progress Notes (Signed)
    Chief Complaint  Patient presents with  . Follow-up    3 month   Has lost 6 pounds Heartburn is better on the omeprazole.  Sometimes take 2 if eats spicy food Is trying to be more active Is stressed at home with a sick child Has cut down on smoking We reviewed her abnormal PAP and she is reminded to make a f/u with GYN for colposcopy   Patient Active Problem List   Diagnosis Date Noted  . Tobacco abuse 11/30/2016  . Obese 11/30/2016  . Stress at home 04/23/2014  . Depression 04/26/2013  . 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 ibuprofen (ADVIL,MOTRIN) 600 MG tablet Take 1 tablet (600 mg total) by mouth every 6 (six) hours.  . Norethindrone-Ethinyl Estradiol-Fe Biphas (LO LOESTRIN FE) 1 MG-10 MCG / 10 MCG tablet Take 1 tablet by mouth daily.  Marland Kitchen. omeprazole (PRILOSEC) 20 MG capsule Take 1 capsule (20 mg total) by mouth daily.   No facility-administered encounter medications on file as of 03/01/2017.     Allergies  Allergen Reactions  . Amoxicillin Hives    Review of Systems  Constitutional: Positive for unexpected weight change. Negative for activity change and appetite change.  HENT: Negative for postnasal drip and rhinorrhea.   Eyes: Negative for photophobia and visual disturbance.  Respiratory: Negative for cough and shortness of breath.   Cardiovascular: Negative for chest pain and palpitations.  Gastrointestinal: Negative for abdominal pain and diarrhea.  Genitourinary: Negative for menstrual problem and vaginal bleeding.  Neurological: Positive for headaches.       From "stress"  Psychiatric/Behavioral: Negative for dysphoric mood. The patient is not nervous/anxious.     BP 128/78 (BP Location: Left Arm, Patient Position: Sitting, Cuff Size: Normal)   Pulse (!) 112   Temp (!) 96.4 F (35.8 C) (Temporal)   Resp 16   Ht 5\' 2"  (1.575 m)   Wt 205 lb 1.9 oz (93 kg)   SpO2 97%   BMI  37.52 kg/m   Physical Exam  Constitutional: She is oriented to person, place, and time. She appears well-developed and well-nourished. No distress.  overweight  HENT:  Head: Normocephalic and atraumatic.  Mouth/Throat: Oropharynx is clear and moist.  Eyes: Conjunctivae are normal. Pupils are equal, round, and reactive to light.  Neck: Normal range of motion.  Cardiovascular: Normal rate, regular rhythm and normal heart sounds.   Rate 92-reg  Pulmonary/Chest: Effort normal and breath sounds normal.  Musculoskeletal: She exhibits no edema.  Lymphadenopathy:    She has no cervical adenopathy.  Neurological: She is alert and oriented to person, place, and time.  Psychiatric: She has a normal mood and affect. Her behavior is normal. Thought content normal.    ASSESSMENT/PLAN:  1. Gastroesophageal reflux disease, esophagitis presence not specified   2. Tobacco abuse   3. Class 2 obesity due to excess calories without serious comorbidity with body mass index (BMI) of 37.0 to 37.9 in adult    Patient Instructions  Congratulations on losing weight Walk every day that you are able  Call the GYN for a follow up  See me in six months    Eustace MooreYvonne Sue Fredrich Cory, MD

## 2017-03-12 ENCOUNTER — Telehealth: Payer: Self-pay | Admitting: Advanced Practice Midwife

## 2017-03-12 NOTE — Telephone Encounter (Signed)
Pt would like a call back from the nurse re: questions.

## 2017-03-12 NOTE — Telephone Encounter (Signed)
Patient called with complaints of dark green and yellow breast discharge. States she only noticed it today and when squeezing them. Spoke with Victorino DikeJennifer who stated she would like for patient to be seen and have TSH/prolactin levels drawn before her visit. Pt to be seen this Friday.

## 2017-03-16 ENCOUNTER — Encounter: Payer: Self-pay | Admitting: Adult Health

## 2017-03-16 ENCOUNTER — Other Ambulatory Visit: Payer: Medicaid Other

## 2017-03-16 ENCOUNTER — Ambulatory Visit (INDEPENDENT_AMBULATORY_CARE_PROVIDER_SITE_OTHER): Payer: Medicaid Other | Admitting: Adult Health

## 2017-03-16 VITALS — BP 130/78 | HR 88 | Ht 62.0 in | Wt 207.0 lb

## 2017-03-16 DIAGNOSIS — Z87898 Personal history of other specified conditions: Secondary | ICD-10-CM | POA: Diagnosis not present

## 2017-03-16 DIAGNOSIS — N6452 Nipple discharge: Secondary | ICD-10-CM

## 2017-03-16 DIAGNOSIS — R252 Cramp and spasm: Secondary | ICD-10-CM

## 2017-03-16 DIAGNOSIS — Z8742 Personal history of other diseases of the female genital tract: Secondary | ICD-10-CM

## 2017-03-16 NOTE — Progress Notes (Signed)
Subjective:     Patient ID: Corinna GabKristen Curbow, female   DOB: 05-Feb-1986, 31 y.o.   MRN: [id number]  HPI [name] is a 31 year old white female in complaining of having had breast discharge about 4 days ago that was green and happened after scratched breast when had some itching, R>L.And has had on and off back spasm, none now.Had abnormal pap in January and has not had colpo yet.   Review of Systems Breast discharge Back spasm Reviewed past medical,surgical, social and family history. Reviewed medications and allergies.     Objective:   Physical Exam BP 130/78 (BP Location: Left Arm, Patient Position: Sitting, Cuff Size: Large)   Pulse 88   Ht 5\' 2"  (1.575 m)   Wt 207 lb (93.9 kg)   BMI 37.86 kg/m   Skin warm and dry,  Breasts:no dominate palpable mass, retraction or nipple discharge. No point tenderness on back at present.   Discussed leaving breast alone, use ice and heat on back can take advil, and continues, let me know.Also  Told her will make colpo appt in near future, and she agrees. Face time 15 minutes with 50% counseling and coordinating care.  Assessment:     Breast discharge Spasms in back Hx abnormal pap    Plan:     Check TSH and prolactin before exam Leave breast alone Try ice and heat on back Return in 2 weeks with colpo with Dr Despina HiddenEure

## 2017-03-17 LAB — TSH: TSH: 1.14 u[IU]/mL (ref 0.450–4.500)

## 2017-03-17 LAB — PROLACTIN: Prolactin: 11.8 ng/mL (ref 4.8–23.3)

## 2017-03-20 ENCOUNTER — Telehealth: Payer: Self-pay | Admitting: *Deleted

## 2017-03-20 NOTE — Telephone Encounter (Signed)
Spoke with pt letting her know prolactin and TSH were both negative. Watch for spontaneous discharge but don't squeeze breasts. Pt voiced understanding. JSY

## 2017-03-30 ENCOUNTER — Encounter: Payer: Medicaid Other | Admitting: Obstetrics & Gynecology

## 2017-04-03 ENCOUNTER — Ambulatory Visit: Payer: Medicaid Other | Admitting: Advanced Practice Midwife

## 2017-04-05 ENCOUNTER — Telehealth: Payer: Self-pay | Admitting: Adult Health

## 2017-04-08 ENCOUNTER — Emergency Department (HOSPITAL_COMMUNITY): Payer: Medicaid Other

## 2017-04-08 ENCOUNTER — Encounter (HOSPITAL_COMMUNITY): Payer: Self-pay | Admitting: Emergency Medicine

## 2017-04-08 ENCOUNTER — Emergency Department (HOSPITAL_COMMUNITY)
Admission: EM | Admit: 2017-04-08 | Discharge: 2017-04-08 | Disposition: A | Discharge status: Home / Self Care | Payer: Medicaid Other | Attending: Emergency Medicine | Admitting: Emergency Medicine

## 2017-04-08 DIAGNOSIS — Z7982 Long term (current) use of aspirin: Secondary | ICD-10-CM | POA: Diagnosis not present

## 2017-04-08 DIAGNOSIS — Y999 Unspecified external cause status: Secondary | ICD-10-CM | POA: Insufficient documentation

## 2017-04-08 DIAGNOSIS — S70212A Abrasion, left hip, initial encounter: Secondary | ICD-10-CM | POA: Insufficient documentation

## 2017-04-08 DIAGNOSIS — Y92410 Unspecified street and highway as the place of occurrence of the external cause: Secondary | ICD-10-CM | POA: Insufficient documentation

## 2017-04-08 DIAGNOSIS — S79912A Unspecified injury of left hip, initial encounter: Secondary | ICD-10-CM | POA: Diagnosis present

## 2017-04-08 DIAGNOSIS — M7918 Myalgia, other site: Secondary | ICD-10-CM

## 2017-04-08 DIAGNOSIS — Y9389 Activity, other specified: Secondary | ICD-10-CM | POA: Diagnosis not present

## 2017-04-08 DIAGNOSIS — R0789 Other chest pain: Secondary | ICD-10-CM | POA: Diagnosis not present

## 2017-04-08 DIAGNOSIS — F17210 Nicotine dependence, cigarettes, uncomplicated: Secondary | ICD-10-CM | POA: Diagnosis not present

## 2017-04-08 LAB — URINALYSIS, ROUTINE W REFLEX MICROSCOPIC
Bilirubin Urine: NEGATIVE
Glucose, UA: NEGATIVE mg/dL
HGB URINE DIPSTICK: NEGATIVE
KETONES UR: NEGATIVE mg/dL
Nitrite: NEGATIVE
PH: 5 (ref 5.0–8.0)
Protein, ur: 30 mg/dL — AB
SPECIFIC GRAVITY, URINE: 1.024 (ref 1.005–1.030)

## 2017-04-08 LAB — PREGNANCY, URINE: PREG TEST UR: NEGATIVE

## 2017-04-08 MED ORDER — NAPROXEN 500 MG PO TABS: 500.0000 mg | ORAL_TABLET | Freq: Two times a day (BID) | ORAL | 0 refills | Status: DC
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
## 2017-04-08 NOTE — ED Notes (Signed)
Pt removed c-collar, cms intact all extremities,

## 2017-04-08 NOTE — ED Notes (Signed)
Pt continues to have pain of left hip and chest area that is worse with movement and breathing, update given,

## 2017-04-08 NOTE — Discharge Instructions (Signed)
Expect to be stiff and sore over the next week or so. Take the medications as needed for pain. Monitor for fever, vomiting, worsening symptoms
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
## 2017-04-10 ENCOUNTER — Encounter: Payer: Self-pay | Admitting: Obstetrics & Gynecology

## 2017-04-10 ENCOUNTER — Ambulatory Visit (INDEPENDENT_AMBULATORY_CARE_PROVIDER_SITE_OTHER): Payer: Medicaid Other | Admitting: Obstetrics & Gynecology

## 2017-04-10 VITALS — BP 130/90 | HR 80 | Wt 211.0 lb

## 2017-04-10 DIAGNOSIS — Z3202 Encounter for pregnancy test, result negative: Secondary | ICD-10-CM

## 2017-04-10 DIAGNOSIS — N870 Mild cervical dysplasia: Secondary | ICD-10-CM | POA: Diagnosis not present

## 2017-04-10 LAB — POCT URINE PREGNANCY: Preg Test, Ur: NEGATIVE

## 2017-04-10 NOTE — Progress Notes (Signed)
Patient ID: Megan Contreras, female   DOB: 1986/02/26, 31 y.o.   MRN: [id number]  Colposcopy Procedure Note:  Colposcopy Procedure Note  Indications: Pap smear 1 months ago showed: low-grade squamous intraepithelial neoplasia (LGSIL - encompassing HPV,mild dysplasia,CIN I). The prior pap showed no abnormalities.  Prior cervical/vaginal disease: normal exam without visible pathology. Prior cervical treatment: no treatment.  Smoker:  Yes.  minimal New sexual partner:  Yes.    : time frame:  No.  History of abnormal Pap: yes  Procedure Details  The risks and benefits of the procedure and Written informed consent obtained.  Speculum placed in vagina and excellent visualization of cervix achieved, cervix swabbed x 3 with acetic acid solution.  Findings: Cervix: no visible lesions, no mosaicism, no punctation and no abnormal vasculature; SCJ visualized 360 degrees without lesions and no biopsies taken. Vaginal inspection: normal without visible lesions. Vulvar colposcopy: vulvar colposcopy not performed.  Specimens: none  Complications: none.  Plan: Follow up Pap smear 1 year

## 2017-04-16 ENCOUNTER — Ambulatory Visit (INDEPENDENT_AMBULATORY_CARE_PROVIDER_SITE_OTHER): Payer: Medicaid Other | Admitting: Adult Health

## 2017-04-16 ENCOUNTER — Encounter: Payer: Self-pay | Admitting: Adult Health

## 2017-04-16 VITALS — BP 128/72 | HR 92 | Ht 61.25 in | Wt 202.0 lb

## 2017-04-16 DIAGNOSIS — Z713 Dietary counseling and surveillance: Secondary | ICD-10-CM | POA: Diagnosis not present

## 2017-04-16 DIAGNOSIS — Z6837 Body mass index (BMI) 37.0-37.9, adult: Secondary | ICD-10-CM

## 2017-04-16 MED ORDER — PHENTERMINE HCL 37.5 MG PO CAPS: 37.5000 mg | ORAL_CAPSULE | ORAL | 0 refills | Status: DC

## 2017-04-16 NOTE — Progress Notes (Signed)
Subjective:     Patient ID: Natalin Bible, female   DOB: January 14, 1986, 31 y.o.   MRN: [id number]  HPI Markitta is a 31 year old white female in to talk about weight loss pills.She has tried to lose weight and is having hard time  Review of Systems Patient denies any headaches, hearing loss, fatigue, blurred vision, shortness of breath, chest pain, abdominal pain, problems with bowel movements, urination, or intercourse. No joint pain or mood swings.Wants to lose weight Reviewed past medical,surgical, social and family history. Reviewed medications and allergies.     Objective:   Physical Exam BP 128/72 (BP Location: Left Arm, Patient Position: Sitting, Cuff Size: Large)   Pulse 92   Ht 5' 1.25" (1.556 m)   Wt 202 lb (91.6 kg)   LMP 03/29/2017 (Approximate)   BMI 37.86 kg/m    Skin warm and dry. Lungs: clear to ausculation bilaterally. Cardiovascular: regular rate and rhythm. Assessment:     1. Weight loss counseling, encounter for   2. Body mass index 37.0-37.9, adult       Plan:    decrease carbs and increase water and walking Rx adipex 37.5 mg #30 take 1 daily Follow up in 4 weeks

## 2017-05-14 ENCOUNTER — Encounter: Payer: Self-pay | Admitting: Adult Health

## 2017-05-14 ENCOUNTER — Ambulatory Visit (INDEPENDENT_AMBULATORY_CARE_PROVIDER_SITE_OTHER): Payer: Medicaid Other | Admitting: Adult Health

## 2017-05-14 VITALS — BP 120/80 | HR 90 | Ht 61.0 in | Wt 196.5 lb

## 2017-05-14 DIAGNOSIS — Z713 Dietary counseling and surveillance: Secondary | ICD-10-CM | POA: Diagnosis not present

## 2017-05-14 DIAGNOSIS — Z6837 Body mass index (BMI) 37.0-37.9, adult: Secondary | ICD-10-CM

## 2017-05-14 MED ORDER — PHENTERMINE HCL 37.5 MG PO CAPS: 37.5000 mg | ORAL_CAPSULE | ORAL | 0 refills | Status: DC

## 2017-05-14 NOTE — Progress Notes (Signed)
Subjective:     Patient ID: Megan Contreras, female   DOB: 02-May-1986, 31 y.o.   MRN: [id number]  HPI Megan Contreras is a 31 year old white female in for weight and BP check, has been taking adipex for weight loss and has no complaints.   Review of Systems Patient denies any headaches, hearing loss, fatigue, blurred vision, shortness of breath, chest pain, abdominal pain, problems with bowel movements, urination, or intercourse. No joint pain or mood swings. Reviewed past medical,surgical, social and family history. Reviewed medications and allergies.     Objective:   Physical Exam BP 120/80 (BP Location: Left Arm, Patient Position: Sitting, Cuff Size: Normal)   Pulse 90   Ht 5\' 1"  (1.549 m)   Wt 196 lb 8 oz (89.1 kg)   LMP 04/29/2017 (Approximate)   BMI 37.13 kg/m    Skin warm and dry.  Lungs: clear to ausculation bilaterally. Cardiovascular: regular rate and rhythm.Lost 5.5 lbs and wants to continue adipex.  Praised over weight loss and her walking more.  Assessment:     1. Weight loss counseling, encounter for   2. Body mass index 37.0-37.9, adult       Plan:     Meds ordered this encounter  Medications  . phentermine 37.5 MG capsule    Sig: Take 1 capsule (37.5 mg total) by mouth every morning.    Dispense:  30 capsule    Refill:  0    Order Specific Question:   Supervising Provider    Answer:   Duane LopeEURE, LUTHER H [[id number]]  Follow up in 4 weeks Continue walking

## 2017-06-11 ENCOUNTER — Ambulatory Visit (INDEPENDENT_AMBULATORY_CARE_PROVIDER_SITE_OTHER): Payer: Medicaid Other | Admitting: Adult Health

## 2017-06-11 ENCOUNTER — Encounter: Payer: Self-pay | Admitting: Adult Health

## 2017-06-11 VITALS — BP 130/82 | HR 105 | Ht 61.0 in | Wt 189.0 lb

## 2017-06-11 DIAGNOSIS — Z713 Dietary counseling and surveillance: Secondary | ICD-10-CM | POA: Diagnosis not present

## 2017-06-11 DIAGNOSIS — Z6835 Body mass index (BMI) 35.0-35.9, adult: Secondary | ICD-10-CM | POA: Diagnosis not present

## 2017-06-11 MED ORDER — PHENTERMINE HCL 37.5 MG PO CAPS: 37.5000 mg | ORAL_CAPSULE | ORAL | 0 refills | Status: DC

## 2017-06-11 NOTE — Progress Notes (Signed)
Subjective:     Patient ID: Megan Contreras, female   DOB: March 02, 1986, 31 y.o.   MRN: [id number]  HPI Megan Contreras is a 31 year old white female in for weight and BP check, is taking adipex to aide in weight loss.  Review of Systems Patient denies any headaches, hearing loss, fatigue, blurred vision, shortness of breath, chest pain, abdominal pain, problems with bowel movements, urination, or intercourse. No joint pain or mood swings. Reviewed past medical,surgical, social and family history. Reviewed medications and allergies.     Objective:   Physical Exam BP 130/82 (BP Location: Right Arm, Patient Position: Sitting, Cuff Size: Normal)   Pulse (!) 105   Ht 5\' 1"  (1.549 m)   Wt 189 lb (85.7 kg)   LMP 05/31/2017 (Approximate)   BMI 35.71 kg/m  Skin warm and dry.  Lungs: clear to ausculation bilaterally. Cardiovascular: regular rate and rhythm.   Has lost 7.5 more lbs, for total of 13, she is happy.  Assessment:     1. Weight loss counseling, encounter for   2. Body mass index 35.0-35.9, adult       Plan:     Meds ordered this encounter  Medications  . phentermine 37.5 MG capsule    Sig: Take 1 capsule (37.5 mg total) by mouth every morning.    Dispense:  30 capsule    Refill:  0    Order Specific Question:   Supervising Provider    Answer:   Despina HiddenEURE, LUTHER H [[id number]]  Continue weight loss efforts Follow up in 4 weeks

## 2017-07-09 ENCOUNTER — Ambulatory Visit (INDEPENDENT_AMBULATORY_CARE_PROVIDER_SITE_OTHER): Payer: Medicaid Other | Admitting: Adult Health

## 2017-07-09 ENCOUNTER — Encounter: Payer: Self-pay | Admitting: Adult Health

## 2017-07-09 VITALS — BP 110/90 | HR 80 | Ht 61.0 in | Wt 182.0 lb

## 2017-07-09 DIAGNOSIS — Z713 Dietary counseling and surveillance: Secondary | ICD-10-CM

## 2017-07-09 DIAGNOSIS — Z6834 Body mass index (BMI) 34.0-34.9, adult: Secondary | ICD-10-CM

## 2017-07-09 MED ORDER — PHENTERMINE HCL 37.5 MG PO CAPS: 37.5000 mg | ORAL_CAPSULE | ORAL | 0 refills | Status: DC

## 2017-07-09 NOTE — Progress Notes (Signed)
Subjective:     Patient ID: Megan Contreras, female   DOB: 22-Jun-1986, 31 y.o.   MRN: [id number]  HPI Megan Contreras is 31 year old white female in for a weight and BP check is on adipex, for weight loss, lost 7 mor pounds.   Review of Systems Patient denies any headaches, hearing loss, fatigue, blurred vision, shortness of breath, chest pain, abdominal pain, problems with bowel movements, urination, or intercourse. No joint pain or mood swings. Reviewed past medical,surgical, social and family history. Reviewed medications and allergies.     Objective:   Physical Exam BP 110/90 (BP Location: Left Arm, Patient Position: Sitting, Cuff Size: Small)   Pulse 80   Ht 5\' 1"  (1.549 m)   Wt 182 lb (82.6 kg)   LMP 06/29/2017   BMI 34.39 kg/m  Skin warm and dry.  Lungs: clear to ausculation bilaterally. Cardiovascular: regular rate and rhythm.   Has lost about 20 lbs now and wants to continue with adipex.  Assessment:     1. Weight loss counseling, encounter for   2. Body mass index 34.0-34.9, adult       Plan:     Meds ordered this encounter  Medications  . phentermine 37.5 MG capsule    Sig: Take 1 capsule (37.5 mg total) by mouth every morning.    Dispense:  30 capsule    Refill:  0    Order Specific Question:   Supervising Provider    Answer:   Duane LopeEURE, LUTHER H [[id number]]  Follow up in 4 weeks

## 2017-08-06 ENCOUNTER — Ambulatory Visit (INDEPENDENT_AMBULATORY_CARE_PROVIDER_SITE_OTHER): Payer: Medicaid Other | Admitting: Adult Health

## 2017-08-06 ENCOUNTER — Encounter: Payer: Self-pay | Admitting: Adult Health

## 2017-08-06 VITALS — BP 120/90 | HR 94 | Ht 61.25 in | Wt 174.0 lb

## 2017-08-06 DIAGNOSIS — Z6832 Body mass index (BMI) 32.0-32.9, adult: Secondary | ICD-10-CM | POA: Diagnosis not present

## 2017-08-06 DIAGNOSIS — Z713 Dietary counseling and surveillance: Secondary | ICD-10-CM

## 2017-08-06 MED ORDER — PHENTERMINE HCL 37.5 MG PO CAPS: 37.5000 mg | ORAL_CAPSULE | ORAL | 0 refills | Status: DC

## 2017-08-06 NOTE — Progress Notes (Signed)
Subjective:     Patient ID: Megan Contreras, female   DOB: October 24, 1986, 31 y.o.   MRN: [id number]  HPI Megan Contreras is a 31 year old white female in for weight and BP check.Has been wearing her Moms clothes but is able to get her some of hers now in first time in 3 summers. She is proud of her weight loss and would like to continue.  Review of Systems Patient denies any headaches, hearing loss, fatigue, blurred vision, shortness of breath, chest pain, abdominal pain, problems with bowel movements, urination, or intercourse. No joint pain or mood swings. Reviewed past medical,surgical, social and family history. Reviewed medications and allergies.     Objective:   Physical Exam BP 120/90 (BP Location: Left Arm, Patient Position: Sitting, Cuff Size: Normal)   Pulse 94   Ht 5' 1.25" (1.556 m)   Wt 174 lb (78.9 kg)   LMP 07/27/2017 (Exact Date)   BMI 32.61 kg/m  Skin warm and dry.  Lungs: clear to ausculation bilaterally. Cardiovascular: regular rate and rhythm.   Has lost 8 more pounds for total of 33 lbs.  Assessment:     1. Weight loss counseling, encounter for   2. Body mass index 32.0-32.9, adult       Plan:    Continue weight loss efforts Meds ordered this encounter  Medications  . phentermine 37.5 MG capsule    Sig: Take 1 capsule (37.5 mg total) by mouth every morning.    Dispense:  30 capsule    Refill:  0    Order Specific Question:   Supervising Provider    Answer:   Duane LopeEURE, LUTHER H [[id number]]  Follow up in 4 weeks

## 2017-08-06 NOTE — Patient Instructions (Signed)
Follow-up in 4 weeks

## 2017-09-03 ENCOUNTER — Ambulatory Visit: Payer: Medicaid Other | Admitting: Family Medicine

## 2017-09-03 ENCOUNTER — Ambulatory Visit (INDEPENDENT_AMBULATORY_CARE_PROVIDER_SITE_OTHER): Payer: Medicaid Other | Admitting: Adult Health

## 2017-09-03 ENCOUNTER — Encounter: Payer: Self-pay | Admitting: Adult Health

## 2017-09-03 VITALS — BP 110/82 | HR 102 | Ht 61.0 in | Wt 171.0 lb

## 2017-09-03 DIAGNOSIS — Z713 Dietary counseling and surveillance: Secondary | ICD-10-CM

## 2017-09-03 DIAGNOSIS — Z6832 Body mass index (BMI) 32.0-32.9, adult: Secondary | ICD-10-CM

## 2017-09-03 MED ORDER — PHENTERMINE HCL 37.5 MG PO CAPS: 37.5000 mg | ORAL_CAPSULE | ORAL | 0 refills | Status: DC

## 2017-09-03 NOTE — Progress Notes (Signed)
Subjective:     Patient ID: Megan Contreras, female   DOB: [birth year], 31 y.o.   MRN: [id number]  HPI Megan Contreras is a 31 year old white female in for weight and BP check. She is exercising more she says and feels much better, has more energy.   Review of Systems  Patient denies any headaches, hearing loss, fatigue, blurred vision, shortness of breath, chest pain, abdominal pain, problems with bowel movements, urination, or intercourse. No joint pain or mood swings. Reviewed past medical,surgical, social and family history. Reviewed medications and allergies.     Objective:   Physical Exam BP 110/82 (BP Location: Left Arm, Patient Position: Sitting, Cuff Size: Normal)   Pulse (!) 102   Ht 5\' 1"  (1.549 m)   Wt 171 lb (77.6 kg)   LMP 08/20/2017 (Approximate)   BMI 32.31 kg/m  Skin warm and dry.  Lungs: clear to ausculation bilaterally. Cardiovascular: regular rate and rhythm.   Has lost 3 more lbs for total on 31 in adipex and 40 total since April.  Assessment:     1. Weight loss counseling, encounter for   2. Body mass index 32.0-32.9, adult       Plan:     Rx adipex 37.5 mg Take 1 daily #30 F/U in 6 weeks Start not taking every day

## 2017-09-03 NOTE — Patient Instructions (Addendum)
F/U in 6 weeks Continue wight loss efforts, but can start not taking adipex daily

## 2017-09-05 ENCOUNTER — Ambulatory Visit (INDEPENDENT_AMBULATORY_CARE_PROVIDER_SITE_OTHER): Payer: Medicaid Other | Admitting: Family Medicine

## 2017-09-05 ENCOUNTER — Encounter: Payer: Self-pay | Admitting: Family Medicine

## 2017-09-05 VITALS — BP 118/86 | HR 100 | Temp 97.1°F | Resp 16 | Ht 61.0 in | Wt 171.1 lb

## 2017-09-05 DIAGNOSIS — Z23 Encounter for immunization: Secondary | ICD-10-CM

## 2017-09-05 DIAGNOSIS — F4323 Adjustment disorder with mixed anxiety and depressed mood: Secondary | ICD-10-CM

## 2017-09-05 DIAGNOSIS — K219 Gastro-esophageal reflux disease without esophagitis: Secondary | ICD-10-CM

## 2017-09-05 MED ORDER — FLUOXETINE HCL 20 MG PO TABS: 20.0000 mg | ORAL_TABLET | Freq: Every day | ORAL | 3 refills | Status: DC

## 2017-09-05 MED ORDER — PANTOPRAZOLE SODIUM 40 MG PO TBEC: 40.0000 mg | DELAYED_RELEASE_TABLET | Freq: Every day | ORAL | 0 refills | Status: DC

## 2017-09-05 NOTE — Patient Instructions (Signed)
Stop omeprazole Take the protonix Take once a day on an empty stomach  Take the fluoxetine once a day  See me in 1-2  months Call sooner for problems

## 2017-09-05 NOTE — Progress Notes (Signed)
Chief Complaint  Patient presents with  . Follow-up    6 month   Is still very stressed In addition to taking care of her own children she has taken on an elderly grandparent 4 d a week with alzheimers and debility.  Tearful today.  Complains of increased heartburn.  Complains of daily headache.  Is more irritable and tearful Does not feel the omeprazole is helping.  Takes 2 a day.  We discussed changing to protonix She is interested in medicine for depression.  Discussed diet. Exercise. Sleep. Medicine and caring for self Has been successful in losing 30 lbs.  Is happy with her result.  Patient Active Problem List   Diagnosis Date Noted  . Situational mixed anxiety and depressive disorder 09/05/2017  . Tobacco abuse 11/30/2016  . Obese 11/30/2016  . Depression 04/26/2013  . 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 aspirin-acetaminophen-caffeine (EXCEDRIN MIGRAINE) 250-250-65 MG tablet Take 2 tablets by mouth as needed for headache.  . loratadine (CLARITIN) 10 MG tablet Take 10 mg by mouth daily.  . Norethindrone-Ethinyl Estradiol-Fe Biphas (LO LOESTRIN FE) 1 MG-10 MCG / 10 MCG tablet Take 1 tablet by mouth daily.  . phentermine 37.5 MG capsule Take 1 capsule (37.5 mg total) by mouth every morning.  . [DISCONTINUED] omeprazole (PRILOSEC) 20 MG capsule Take 1 capsule (20 mg total) by mouth daily. (Patient taking differently: Take 20 mg by mouth 3 (three) times daily as needed. )  . FLUoxetine (PROZAC) 20 MG tablet Take 1 tablet (20 mg total) by mouth daily.  . pantoprazole (PROTONIX) 40 MG tablet Take 1 tablet (40 mg total) by mouth daily.   No facility-administered encounter medications on file as of 09/05/2017.     Allergies  Allergen Reactions  . Amoxicillin Hives    Review of Systems  Constitutional: Negative for activity change, appetite change and unexpected weight change.  HENT: Negative for  postnasal drip and rhinorrhea.   Eyes: Negative for photophobia and visual disturbance.  Respiratory: Negative for cough and shortness of breath.   Cardiovascular: Negative for chest pain and palpitations.  Gastrointestinal: Positive for abdominal pain. Negative for diarrhea.  Genitourinary: Negative for menstrual problem and vaginal bleeding.  Neurological: Positive for headaches.       From "stress"  Psychiatric/Behavioral: Positive for dysphoric mood. The patient is nervous/anxious.     BP 118/86 (BP Location: Left Arm, Patient Position: Sitting, Cuff Size: Normal)   Pulse 100   Temp (!) 97.1 F (36.2 C) (Temporal)   Resp 16   Ht  (1.549 m)   Wt 171 lb 1.9 oz (77.6 kg)   LMP 08/20/2017 (Approximate)   SpO2 98%   BMI 32.33 kg/m   Physical Exam  Constitutional: She is oriented to person, place, and time. She appears well-developed and well-nourished. No distress.  Overweight- better  HENT:  Head: Normocephalic and atraumatic.  Mouth/Throat: Oropharynx is clear and moist.  Eyes: Pupils are equal, round, and reactive to light. Conjunctivae are normal.  Neck: Normal range of motion.  Cardiovascular: Normal rate, regular rhythm and normal heart sounds.   Pulmonary/Chest: Effort normal and breath sounds normal.  Abdominal: Soft. Bowel sounds are normal. There is tenderness.  epigastric  Musculoskeletal: She exhibits no edema.  Lymphadenopathy:    She has no cervical adenopathy.  Neurological: She is alert and oriented to person, place, and time.  Psychiatric: Her behavior is normal.  Thought content normal.  tearful    ASSESSMENT/PLAN:  1. Gastroesophageal reflux disease, esophagitis presence not specified Change to protonix  2. Need for influenza vaccination - Flu Vaccine QUAD 36+ mos IM  3. Situational mixed anxiety and depressive disorder Discussed - start on fluoxetine Greater than 50% of this visit was spent in counseling and coordinating care.  Total face to  face time:   45 min discussing depression as above   Patient Instructions  Stop omeprazole Take the protonix Take once a day on an empty stomach  Take the fluoxetine once a day  See me in 1-2  months Call sooner for problems   Megan MooreYvonne Sue Gabriellah Rabel, MD

## 2017-10-10 ENCOUNTER — Encounter: Payer: Self-pay | Admitting: Family Medicine

## 2017-10-10 ENCOUNTER — Ambulatory Visit (INDEPENDENT_AMBULATORY_CARE_PROVIDER_SITE_OTHER): Payer: Medicaid Other | Admitting: Family Medicine

## 2017-10-10 VITALS — BP 110/70 | HR 100 | Temp 96.6°F | Resp 16 | Ht 61.0 in | Wt 164.0 lb

## 2017-10-10 DIAGNOSIS — Z720 Tobacco use: Secondary | ICD-10-CM | POA: Diagnosis not present

## 2017-10-10 DIAGNOSIS — F4323 Adjustment disorder with mixed anxiety and depressed mood: Secondary | ICD-10-CM

## 2017-10-10 NOTE — Patient Instructions (Signed)
Stay on the same medicines Stay as active as you can manage  See me in 6 months Call sooner for problems

## 2017-10-10 NOTE — Progress Notes (Signed)
    Chief Complaint  Patient presents with  . Follow-up   Never got protonix filled.  still has heartburn Is taking the fluoxetine daily and feels better.  No side effects Continues to lose weight , 7 more pounds this month.  Is congratulated No complaints today  Patient Active Problem List   Diagnosis Date Noted  . Situational mixed anxiety and depressive disorder 09/05/2017  . Tobacco abuse 11/30/2016  . Obese 11/30/2016  . Depression 04/26/2013  . 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 aspirin-acetaminophen-caffeine (EXCEDRIN MIGRAINE) 250-250-65 MG tablet Take 2 tablets by mouth as needed for headache.  Marland Kitchen. FLUoxetine (PROZAC) 20 MG tablet Take 1 tablet (20 mg total) by mouth daily.  Marland Kitchen. loratadine (CLARITIN) 10 MG tablet Take 10 mg by mouth daily.  . Norethindrone-Ethinyl Estradiol-Fe Biphas (LO LOESTRIN FE) 1 MG-10 MCG / 10 MCG tablet Take 1 tablet by mouth daily.  . pantoprazole (PROTONIX) 40 MG tablet Take 1 tablet (40 mg total) by mouth daily.  . phentermine 37.5 MG capsule Take 1 capsule (37.5 mg total) by mouth every morning.   No facility-administered encounter medications on file as of 10/10/2017.     Allergies  Allergen Reactions  . Amoxicillin Hives    Review of Systems  Constitutional: Negative for activity change, appetite change and unexpected weight change.  HENT: Negative for postnasal drip and rhinorrhea.   Eyes: Negative for photophobia and visual disturbance.  Respiratory: Negative for cough and shortness of breath.   Cardiovascular: Negative for chest pain and palpitations.  Gastrointestinal: Negative for abdominal pain and diarrhea.       Heartburn  Genitourinary: Negative for menstrual problem and vaginal bleeding.  Neurological: Negative for headaches.  Psychiatric/Behavioral: Negative for dysphoric mood. The patient is nervous/anxious.        Imrpoving   BP 110/70 (BP  Location: Right Arm, Patient Position: Sitting, Cuff Size: Normal)   Pulse 100   Temp (!) 96.6 F (35.9 C) (Temporal)   Resp 16   Ht 5\' 1"  (1.549 m)   Wt 164 lb (74.4 kg)   SpO2 98%   BMI 30.99 kg/m   Physical Exam  Constitutional: She is oriented to person, place, and time. She appears well-developed and well-nourished. No distress.  HENT:  Head: Atraumatic.  Mouth/Throat: Oropharynx is clear and moist.  Cardiovascular: Normal rate, regular rhythm and normal heart sounds.   Pulmonary/Chest: Effort normal and breath sounds normal.  Neurological: She is alert and oriented to person, place, and time.  Psychiatric: She has a normal mood and affect. Her behavior is normal. Thought content normal.    ASSESSMENT/PLAN:  1. Situational mixed anxiety and depressive disorder improved  2. Tobacco abuse discussed   Patient Instructions  Stay on the same medicines Stay as active as you can manage  See me in 6 months Call sooner for problems   Eustace MooreYvonne Sue Alessandria Henken, MD

## 2017-10-15 ENCOUNTER — Encounter: Payer: Self-pay | Admitting: Adult Health

## 2017-10-15 ENCOUNTER — Ambulatory Visit (INDEPENDENT_AMBULATORY_CARE_PROVIDER_SITE_OTHER): Payer: Medicaid Other | Admitting: Adult Health

## 2017-10-15 VITALS — BP 128/90 | HR 108 | Ht 61.0 in | Wt 164.0 lb

## 2017-10-15 DIAGNOSIS — Z6830 Body mass index (BMI) 30.0-30.9, adult: Secondary | ICD-10-CM | POA: Diagnosis not present

## 2017-10-15 DIAGNOSIS — Z713 Dietary counseling and surveillance: Secondary | ICD-10-CM | POA: Insufficient documentation

## 2017-10-15 NOTE — Progress Notes (Signed)
Subjective:     Patient ID: Megan Contreras, female   DOB: 1986/04/14, 31 y.o.   MRN: [id number]  HPI [name] is a 31 year old white female in for weight and BP check.  Review of Systems +weight loss Patient denies any headaches, hearing loss, fatigue, blurred vision, shortness of breath, chest pain, abdominal pain, problems with bowel movements, urination, or intercourse. No joint pain or mood swings. Reviewed past medical,surgical, social and family history. Reviewed medications and allergies.     Objective:   Physical Exam BP 128/90 (BP Location: Right Arm, Patient Position: Sitting, Cuff Size: Small)   Pulse (!) 108   Ht 5\' 1"  (1.549 m)   Wt 164 lb (74.4 kg)   LMP 09/21/2017   BMI 30.99 kg/m  Skin warm and dry. Lungs: clear to ausculation bilaterally. Cardiovascular: regular rate and rhythm.   lost 7 more lbs for total of 38, will take stop adipex for now and see how she does without it. PHQ 9 score 7 is on meds and denies any suicidal ideations.  Assessment:     1. Weight loss counseling, encounter for   2. Body mass index 30.0-30.9, adult       Plan:     Will stop adipex for now and see how she does without it Follow up in 4 weeks and if wants to restart will do short course of adipex

## 2017-11-12 ENCOUNTER — Encounter: Payer: Self-pay | Admitting: Adult Health

## 2017-11-12 ENCOUNTER — Ambulatory Visit: Payer: Medicaid Other | Admitting: Adult Health

## 2017-11-12 VITALS — BP 130/90 | HR 94 | Ht 61.0 in | Wt 164.5 lb

## 2017-11-12 DIAGNOSIS — R0981 Nasal congestion: Secondary | ICD-10-CM | POA: Diagnosis not present

## 2017-11-12 DIAGNOSIS — R05 Cough: Secondary | ICD-10-CM | POA: Diagnosis not present

## 2017-11-12 DIAGNOSIS — R059 Cough, unspecified: Secondary | ICD-10-CM | POA: Insufficient documentation

## 2017-11-12 MED ORDER — BENZONATATE 200 MG PO CAPS: 200.0000 mg | ORAL_CAPSULE | Freq: Three times a day (TID) | ORAL | 0 refills | Status: DC | PRN

## 2017-11-12 MED ORDER — AZITHROMYCIN 250 MG PO TABS
ORAL_TABLET | ORAL | 0 refills | Status: DC
Start: 2017-11-12 — End: 2017-12-14

## 2017-11-12 NOTE — Progress Notes (Signed)
Subjective:     Patient ID: Megan Contreras, female   DOB: 01-21-1986, 31 y.o.   MRN: [id number]  HPI Megan Contreras is a 31 year old white female in complaining of sinus congestion and cough and for weight check has been off adipex since October 22 and has not gained or lost.   Review of Systems Has congestion and cough for over a month, has used OTC meds without relief Weight stable Reviewed past medical,surgical, social and family history. Reviewed medications and allergies.     Objective:   Physical Exam BP 130/90 (BP Location: Left Arm, Patient Position: Sitting, Cuff Size: Normal)   Pulse 94   Ht 5\' 1"  (1.549 m)   Wt 164 lb 8 oz (74.6 kg)   LMP 10/31/2017   BMI 31.08 kg/m  Skin warm and dry. Lungs: clear to ausculation bilaterally. Cardiovascular: regular rate and rhythm.+sinus tenderness in frontal and maxillary sinuses.     Assessment:     1. Congestion of nasal sinus   2. Cough       Plan:    Push fluids Meds ordered this encounter  Medications  . azithromycin (ZITHROMAX) 250 MG tablet    Sig: Take 2 now and 1 daily    Dispense:  6 tablet    Refill:  0    Order Specific Question:   Supervising Provider    Answer:   EURE, LUTHER H [[id number]]  . benzonatate (TESSALON) 200 MG capsule    Sig: Take 1 capsule (200 mg total) 3 (three) times daily as needed by mouth for cough.    Dispense:  20 capsule    Refill:  0    Order Specific Question:   Supervising Provider    Answer:   Duane LopeEURE, LUTHER H [[id number]]  F/U prn

## 2017-12-12 ENCOUNTER — Other Ambulatory Visit: Payer: Self-pay | Admitting: Advanced Practice Midwife

## 2017-12-14 ENCOUNTER — Encounter: Payer: Self-pay | Admitting: Adult Health

## 2017-12-14 ENCOUNTER — Ambulatory Visit: Payer: Medicaid Other | Admitting: Adult Health

## 2017-12-14 VITALS — BP 130/80 | HR 79 | Ht 61.0 in | Wt 164.0 lb

## 2017-12-14 DIAGNOSIS — Z113 Encounter for screening for infections with a predominantly sexual mode of transmission: Secondary | ICD-10-CM | POA: Diagnosis not present

## 2017-12-14 DIAGNOSIS — Z3041 Encounter for surveillance of contraceptive pills: Secondary | ICD-10-CM | POA: Diagnosis not present

## 2017-12-14 NOTE — Progress Notes (Signed)
Subjective:     Patient ID: Megan Contreras, female   DOB: March 10, 1986, 31 y.o.   MRN: [id number]  HPI [name] is a 31 year old white female in requesting STD screening and talk about birth control.She is getting divorce and is dating again, and wants to make sure Lo Loestrin is not too low.  Review of Systems Patient denies any headaches, hearing loss, fatigue, blurred vision, shortness of breath, chest pain, abdominal pain, problems with bowel movements, urination, or intercourse. No joint pain or mood swings. Reviewed past medical,surgical, social and family history. Reviewed medications and allergies.     Objective:   Physical Exam BP 130/80 (BP Location: Left Arm, Cuff Size: Normal)   Pulse 79   Ht 5\' 1"  (1.549 m)   Wt 164 lb (74.4 kg)   LMP 11/18/2017   BMI 30.99 kg/m  Skin warm and dry.Pelvic: external genitalia is normal in appearance no lesions, vagina: white discharge without odor,urethra has no lesions or masses noted, cervix:smooth and bulbous, uterus: normal size, shape and contour, non tender, no masses felt, adnexa: no masses or tenderness noted. Bladder is non tender and no masses felt.  GC/CHL obtained.    Discussed continuing Lo Loestrin, take daily.Can always use condoms, too.   Assessment:     1. Screening examination for STD (sexually transmitted disease)   2. Encounter for surveillance of contraceptive pills       Plan:     GC/CHL sent Check HIV and RPR Continue Lo Loestrin F/U prn

## 2017-12-15 LAB — HIV ANTIBODY (ROUTINE TESTING W REFLEX): HIV Screen 4th Generation wRfx: NONREACTIVE

## 2017-12-15 LAB — RPR: RPR Ser Ql: NONREACTIVE

## 2017-12-16 LAB — GC/CHLAMYDIA PROBE AMP
CHLAMYDIA, DNA PROBE: NEGATIVE
NEISSERIA GONORRHOEAE BY PCR: NEGATIVE

## 2018-03-04 ENCOUNTER — Encounter: Payer: Self-pay | Admitting: Family Medicine

## 2018-04-10 ENCOUNTER — Ambulatory Visit: Payer: Medicaid Other | Admitting: Family Medicine
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
## 2018-08-30 ENCOUNTER — Encounter: Payer: Self-pay | Admitting: Adult Health

## 2018-08-30 ENCOUNTER — Other Ambulatory Visit (HOSPITAL_COMMUNITY)
Admission: RE | Admit: 2018-08-30 | Discharge: 2018-08-30 | Disposition: A | Discharge status: Home / Self Care | Payer: Medicaid Other | Source: Ambulatory Visit | Attending: Adult Health | Admitting: Adult Health

## 2018-08-30 ENCOUNTER — Other Ambulatory Visit: Payer: Self-pay

## 2018-08-30 ENCOUNTER — Ambulatory Visit (INDEPENDENT_AMBULATORY_CARE_PROVIDER_SITE_OTHER): Payer: Medicaid Other | Admitting: Adult Health

## 2018-08-30 VITALS — BP 115/77 | HR 81 | Ht 61.0 in | Wt 185.0 lb

## 2018-08-30 DIAGNOSIS — Z713 Dietary counseling and surveillance: Secondary | ICD-10-CM

## 2018-08-30 DIAGNOSIS — Z87898 Personal history of other specified conditions: Secondary | ICD-10-CM | POA: Insufficient documentation

## 2018-08-30 DIAGNOSIS — Z6834 Body mass index (BMI) 34.0-34.9, adult: Secondary | ICD-10-CM | POA: Diagnosis not present

## 2018-08-30 DIAGNOSIS — Z124 Encounter for screening for malignant neoplasm of cervix: Secondary | ICD-10-CM

## 2018-08-30 DIAGNOSIS — Z01419 Encounter for gynecological examination (general) (routine) without abnormal findings: Secondary | ICD-10-CM | POA: Insufficient documentation

## 2018-08-30 DIAGNOSIS — Z0001 Encounter for general adult medical examination with abnormal findings: Secondary | ICD-10-CM

## 2018-08-30 DIAGNOSIS — Z8742 Personal history of other diseases of the female genital tract: Secondary | ICD-10-CM

## 2018-08-30 MED ORDER — PHENTERMINE HCL 37.5 MG PO CAPS: 37.5000 mg | ORAL_CAPSULE | ORAL | 0 refills | Status: DC

## 2018-08-30 NOTE — Progress Notes (Signed)
Patient ID: Keyairah Romans, female   DOB: 08-13-1986, 32 y.o.   MRN: [id number] History of Present Illness:  Deyja is a 32 year old white female, G2P2 in for a well woman gyn exam and pap, her last pap was LSIL with +HPV 01/03/17.  Current Medications, Allergies, Past Medical History, Past Surgical History, Family History and Social History were reviewed in [employer] record.     Review of Systems:  Patient denies any headaches, hearing loss, fatigue, blurred vision, shortness of breath, chest pain, abdominal pain, problems with bowel movements, urination, or intercourse. No joint pain or mood swings. +gained weight since December about 20 lbs   Physical Exam:BP 115/77 (BP Location: Right Arm, Patient Position: Sitting, Cuff Size: Normal)   Pulse 81   Ht 5\' 1"  (1.549 m)   Wt 185 lb (83.9 kg)   LMP 08/10/2018   BMI 34.96 kg/m  General:  Well developed, well nourished, no acute distress Skin:  Warm and dry Neck:  Midline trachea, normal thyroid, good ROM, no lymphadenopathy Lungs; Clear to auscultation bilaterally Breast:  No dominant palpable mass, retraction, or nipple discharge Cardiovascular: Regular rate and rhythm Abdomen:  Soft, non tender, no hepatosplenomegaly Pelvic:  External genitalia is normal in appearance, no lesions.  The vagina is normal in appearance. Urethra has no lesions or masses. The cervix is bulbous. Pap with HPV and GC/CHL performed.  Uterus is felt to be normal size, shape, and contour.  No adnexal masses or tenderness noted.Bladder is non tender, no masses felt. Extremities/musculoskeletal:  No swelling or varicosities noted, no clubbing or cyanosis Psych:  No mood changes, alert and cooperative,seems happy PHQ 9 score 9, denies being suicidal, declines med. Examination chaperoned by Francene Finders RN. She is requesting adipex again to help in weight loss, since youngest is in Pre K she is hoping to go back to work and wants to loss  weight.   Impression:  1. Encounter for gynecological examination with Papanicolaou smear of cervix   2. Routine cervical smear   3. History of abnormal cervical Pap smear   4. Weight loss counseling, encounter for   5. Body mass index 34.0-34.9, adult      Plan: Meds ordered this encounter  Medications  . phentermine 37.5 MG capsule    Sig: Take 1 capsule (37.5 mg total) by mouth every morning.    Dispense:  30 capsule    Refill:  0    Order Specific Question:   Supervising Provider    Answer:   Duane Lope H [[id number]]   Return in 4 weeks for weight check with BP check Physical and pap in 1 year

## 2018-09-03 ENCOUNTER — Encounter: Payer: Self-pay | Admitting: Adult Health

## 2018-09-03 ENCOUNTER — Telehealth: Payer: Self-pay | Admitting: Adult Health

## 2018-09-03 DIAGNOSIS — R87618 Other abnormal cytological findings on specimens from cervix uteri: Secondary | ICD-10-CM

## 2018-09-03 DIAGNOSIS — R8789 Other abnormal findings in specimens from female genital organs: Secondary | ICD-10-CM | POA: Insufficient documentation

## 2018-09-03 HISTORY — DX: Other abnormal cytological findings on specimens from cervix uteri: R87.618

## 2018-09-03 LAB — CYTOLOGY - PAP
Chlamydia: NEGATIVE
HPV (WINDOPATH): DETECTED — AB
NEISSERIA GONORRHEA: NEGATIVE

## 2018-09-03 NOTE — Telephone Encounter (Signed)
Pt aware that pap is abnormal, LSIL +HPV, negative for GC/CHL, will need colpo, appt made

## 2018-09-26 ENCOUNTER — Ambulatory Visit: Payer: Medicaid Other | Admitting: Obstetrics & Gynecology

## 2018-09-26 ENCOUNTER — Encounter: Payer: Self-pay | Admitting: Obstetrics & Gynecology

## 2018-09-26 VITALS — BP 148/97 | HR 105 | Ht 61.0 in | Wt 180.5 lb

## 2018-09-26 DIAGNOSIS — N870 Mild cervical dysplasia: Secondary | ICD-10-CM

## 2018-09-26 DIAGNOSIS — Z3202 Encounter for pregnancy test, result negative: Secondary | ICD-10-CM

## 2018-09-26 LAB — POCT URINE PREGNANCY: PREG TEST UR: NEGATIVE

## 2018-09-26 NOTE — Progress Notes (Signed)
Colposcopy Procedure Note:  Colposcopy Procedure Note  Indications: Pap smear 1 months ago showed: low-grade squamous intraepithelial neoplasia (LGSIL - encompassing HPV,mild dysplasia,CIN I). The prior pap showed low-grade squamous intraepithelial neoplasia (LGSIL - encompassing HPV,mild dysplasia,CIN I).  Prior cervical/vaginal disease: normal exam without visible pathology. Prior cervical treatment: no treatment.  Smoker:  Yes.   New sexual partner:  No.    History of abnormal Pap: yes  Procedure Details  The risks and benefits of the procedure and Written informed consent obtained.  Speculum placed in vagina and excellent visualization of cervix achieved, cervix swabbed x 3 with acetic acid solution.  Findings: Cervix: no visible lesions, no mosaicism, no punctation and no abnormal vasculature; SCJ visualized 360 degrees without lesions and no biopsies taken. Vaginal inspection: normal without visible lesions. Vulvar colposcopy: vulvar colposcopy not performed.  Specimens: none  Complications: none.  Plan: Follow up Pap 1 year

## 2018-09-27 ENCOUNTER — Ambulatory Visit: Payer: Medicaid Other | Admitting: Adult Health

## 2018-09-27 ENCOUNTER — Encounter: Payer: Self-pay | Admitting: Adult Health

## 2018-09-27 VITALS — BP 136/84 | HR 101 | Ht 61.0 in | Wt 180.0 lb

## 2018-09-27 DIAGNOSIS — Z713 Dietary counseling and surveillance: Secondary | ICD-10-CM | POA: Diagnosis not present

## 2018-09-27 DIAGNOSIS — Z6834 Body mass index (BMI) 34.0-34.9, adult: Secondary | ICD-10-CM | POA: Diagnosis not present

## 2018-09-27 MED ORDER — PHENTERMINE HCL 37.5 MG PO CAPS: 37.5000 mg | ORAL_CAPSULE | ORAL | 0 refills | Status: DC

## 2018-09-27 NOTE — Progress Notes (Signed)
  Subjective:     Patient ID: Megan Contreras, female   DOB: [birth year], 32 y.o.   MRN: [id number]  HPI Megan Contreras is a 32 year old white female in for weight and BP check, has been on adipex a month now.  Review of Systems Patient denies any headaches, hearing loss, fatigue, blurred vision, shortness of breath, chest pain, abdominal pain, problems with bowel movements, urination, or intercourse. No joint pain or mood swings.  Reviewed past medical,surgical, social and family history. Reviewed medications and allergies.     Objective:   Physical Exam BP 136/84 (BP Location: Left Arm, Patient Position: Sitting, Cuff Size: Normal)   Pulse (!) 101   Ht 5\' 1"  (1.549 m)   Wt 180 lb (81.6 kg)   LMP 09/10/2018   BMI 34.01 kg/m   Skin warm and dry.  Lungs: clear to ausculation bilaterally. Cardiovascular: regular rate and rhythm. She lost 5 lbs. And wants to continue adipex. Increase water and exercise.     Assessment:     1. Weight loss counseling, encounter for   2. Body mass index 34.0-34.9, adult       Plan:     Meds ordered this encounter  Medications  . phentermine 37.5 MG capsule    Sig: Take 1 capsule (37.5 mg total) by mouth every morning.    Dispense:  30 capsule    Refill:  0    Order Specific Question:   Supervising Provider    Answer:   Despina Hidden, LUTHER H [[id number]]  F/U in 3 months

## 2018-10-25 ENCOUNTER — Ambulatory Visit: Payer: Medicaid Other | Admitting: Adult Health

## 2020-07-26 ENCOUNTER — Encounter: Payer: Self-pay | Admitting: Adult Health

## 2020-07-26 ENCOUNTER — Ambulatory Visit (INDEPENDENT_AMBULATORY_CARE_PROVIDER_SITE_OTHER): Payer: Medicaid Other | Admitting: Adult Health

## 2020-07-26 VITALS — BP 126/83 | HR 80 | Ht 61.0 in | Wt 194.6 lb

## 2020-07-26 DIAGNOSIS — Z713 Dietary counseling and surveillance: Secondary | ICD-10-CM

## 2020-07-26 DIAGNOSIS — Z6836 Body mass index (BMI) 36.0-36.9, adult: Secondary | ICD-10-CM | POA: Diagnosis not present

## 2020-07-26 MED ORDER — PHENTERMINE HCL 37.5 MG PO TABS: 37.5000 mg | ORAL_TABLET | Freq: Every day | ORAL | 0 refills | Status: DC

## 2020-07-26 NOTE — Progress Notes (Signed)
  Subjective:     Patient ID: Megan Contreras, female   DOB: [birth year], 34 y.o.   MRN: [id number]  HPI Akia is a 34 year old white female,separated, G2P2, in requesting help with weight loss has gained with COVID.   Review of Systems +weight gain Reviewed past medical,surgical, social and family history. Reviewed medications and allergies.     Objective:   Physical Exam BP 126/83 (BP Location: Right Arm, Patient Position: Sitting, Cuff Size: Normal)   Pulse 80   Ht 5\' 1"  (1.549 m)   Wt 194 lb 9.6 oz (88.3 kg)   BMI 36.77 kg/m   Skin warm and dry. Lungs: clear to ausculation bilaterally. Cardiovascular: regular rate and rhythm. Fall risk is low  Upstream - 07/26/20 1006      Pregnancy Intention Screening   Does the patient want to become pregnant in the next year? No    Does the patient's partner want to become pregnant in the next year? No    Would the patient like to discuss contraceptive options today? No      Contraception Wrap Up   Current Method Female Condom    End Method Female Condom    Contraception Counseling Provided Yes             Assessment:     1. Weight loss counseling, encounter for Try to increase water Decrease carbs and portions and walk more Will rx adipex Meds ordered this encounter  Medications  . phentermine (ADIPEX-P) 37.5 MG tablet    Sig: Take 1 tablet (37.5 mg total) by mouth daily before breakfast.    Dispense:  30 tablet    Refill:  0    Order Specific Question:   Supervising Provider    Answer:   09/25/20, LUTHER H [[id number]]    2. Body mass index 36.0-36.9, adult     Plan:     Has appt 8/26 already for pap and physical

## 2020-08-06 ENCOUNTER — Ambulatory Visit (INDEPENDENT_AMBULATORY_CARE_PROVIDER_SITE_OTHER): Payer: Self-pay | Admitting: Internal Medicine

## 2020-08-06 ENCOUNTER — Encounter: Payer: Self-pay | Admitting: Internal Medicine

## 2020-08-06 ENCOUNTER — Other Ambulatory Visit: Payer: Self-pay

## 2020-08-06 VITALS — BP 120/83 | HR 89 | Temp 98.7°F | Ht 61.0 in | Wt 194.0 lb

## 2020-08-06 DIAGNOSIS — R87612 Low grade squamous intraepithelial lesion on cytologic smear of cervix (LGSIL): Secondary | ICD-10-CM

## 2020-08-06 DIAGNOSIS — G5601 Carpal tunnel syndrome, right upper limb: Secondary | ICD-10-CM

## 2020-08-06 DIAGNOSIS — Z7689 Persons encountering health services in other specified circumstances: Secondary | ICD-10-CM

## 2020-08-06 DIAGNOSIS — F418 Other specified anxiety disorders: Secondary | ICD-10-CM

## 2020-08-06 DIAGNOSIS — Z720 Tobacco use: Secondary | ICD-10-CM

## 2020-08-06 MED ORDER — SERTRALINE HCL 50 MG PO TABS: 50.0000 mg | ORAL_TABLET | Freq: Every day | ORAL | 3 refills | Status: DC

## 2020-08-06 NOTE — Patient Instructions (Signed)
Follow up in 3 months Check CBC, CMP, TSH, Lipid panel today Refer to psychiatry

## 2020-08-06 NOTE — Progress Notes (Signed)
New Patient Office Visit  Subjective:  Patient ID: Megan Contreras, female    DOB: 02-06-1986  Age: 34 y.o. MRN: [id number]  CC:  Chief Complaint  Patient presents with  . Gastroesophageal Reflux  . Anxiety  . Depression    HPI Megan Contreras is very pleasant 34 year old female with past medical history of depression with anxiety, panic attacks, abnormal Pap smear with LSIL/HPV, morbid obesity with BMI of 36, tobacco abuse presents my clinic for the first time for establishment of care.  Patient tells me that she has history of depression with anxiety since she was 34 year old.  She was on SSRI before including Paxil, Prozac, Zoloft however she has not been very compliant with it.  Reports that she is very anxious, has panic attacks and low mood however denies active suicidal or homicidal thoughts.  She would like to be back on SSRI and requested for psychiatry referral.  PHQ-9 score: 9  She takes Adipex which was prescribed by her OB/GYN.  Reports symptoms of carpal tunnel syndrome in right hand such as numbness tingling and pain in index and middle finger and thumb especially at nighttime.  She had abnormal Pap smear in 2019.  She tells me that she is scheduled for repeat Pap smear on August 26 with her OB/GYN.  LMP: 07/21, regular cycles, she is G2 P2, sexually active with her boyfriend only.  Had history of STDs in the past.  Denies vaginal discharge.  She takes Excedrin as needed for her headache.  Smokes cigarettes now and then, drinks alcohol occasionally however denies illicit drug use.  She tells me that she has been going through a lot because her son has tetralogy of Fallot it and he underwent multiple heart surgeries at [facility].  Past Medical History:  Diagnosis Date  . Abnormal Pap smear    had HPV  . Abnormal Papanicolaou smear of cervix with positive human papilloma virus (HPV) test 09/03/2018   LSIL+HPV get colpo___  . Anxiety    Phreesia 08/03/2020  . Depression   .  GERD (gastroesophageal reflux disease)   . History of abnormal Pap smear   . Pregnant 07/22/2013  . Vaginal Pap smear, abnormal     Past Surgical History:  Procedure Laterality Date  . DENTAL EXAMINATION UNDER ANESTHESIA    . LEEP      Family History  Problem Relation Age of Onset  . Heart disease Paternal Grandfather   . Diabetes Mother   . Depression Mother   . Drug abuse Mother   . Hyperlipidemia Mother   . Depression Father   . Heart disease Son        tetralogy of fallot  . Hypertension Other   . Cancer Other     Social History   Socioeconomic History  . Marital status: Legally Separated    Spouse name: Not on file  . Number of children: 2  . Years of education: 33  . Highest education level: Not on file  Occupational History  . 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 Smoking status: Current Some Day Smoker    Packs/day: 0.25    Years: 16.00    Pack years: 4.00    Types: Cigarettes  . Smokeless tobacco: Never Used  . Tobacco comment: smokes 4-5 cig daily  Vaping Use  . Vaping Use: Never used  Substance and Sexual Activity  . Alcohol use: Yes    Comment: socially  . Drug  use: No  . Sexual activity: Yes    Birth control/protection: Condom  Other Topics Concern  . 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 Difficulty of Paying Living Expenses:   Food Insecurity:   . Worried About Programme researcher, broadcasting/film/video in the Last Year:   . Barista in the Last Year:   Transportation Needs:   . Freight forwarder (Medical):   Marland Kitchen Lack of Transportation (Non-Medical):   Physical Activity:   . Days of Exercise per Week:   . Minutes of Exercise per Session:   Stress:   . Feeling of  Stress :   Social Connections:   . Frequency of Communication with Friends and Family:   . Frequency of Social Gatherings with Friends and Family:   . Attends Religious Services:   . Active Member of Clubs or Organizations:   . Attends Banker Meetings:   Marland Kitchen Marital Status:   Intimate Partner Violence:   . Fear of Current or Ex-Partner:   . Emotionally Abused:   Marland Kitchen Physically Abused:   . Sexually Abused:     ROS Review of Systems  Constitutional: Negative.   HENT: Negative.   Eyes: Negative.   Respiratory: Negative.   Cardiovascular: Negative.   Gastrointestinal: Negative.   Endocrine: Negative.   Genitourinary: Negative.   Musculoskeletal: Negative.   Allergic/Immunologic: Negative.   Neurological: Negative.   Hematological: Negative.   Psychiatric/Behavioral: The patient is nervous/anxious.     Objective:   Today's Vitals: BP 120/83 (BP Location: Left Arm, Patient Position: Sitting, Cuff Size: Normal)   Pulse 89   Temp 98.7 F (37.1 C) (Oral)   Ht 5\' 1"  (1.549 m)   Wt 194 lb 0.6 oz (88 kg)   SpO2 98%   BMI 36.66 kg/m   Physical Exam Constitutional:      Appearance: She is obese.  HENT:     Head: Normocephalic and atraumatic.     Mouth/Throat:     Mouth: Mucous membranes are moist.  Eyes:     Extraocular Movements: Extraocular movements intact.     Conjunctiva/sclera: Conjunctivae normal.     Pupils: Pupils are equal, round, and reactive to light.  Cardiovascular:     Rate and Rhythm: Normal rate and regular rhythm.     Pulses: Normal pulses.     Heart sounds: Normal heart sounds.  Pulmonary:     Effort: Pulmonary effort is normal.     Breath sounds: Normal breath sounds.  Abdominal:     General: Bowel sounds are normal.     Palpations: Abdomen is soft.  Musculoskeletal:        General: Normal range of motion.     Cervical back: Normal range of motion and neck supple.  Neurological:     General: No focal deficit present.     Mental  Status: She is alert and oriented to person, place, and time.  Psychiatric:        Mood and Affect: Mood normal.        Behavior: Behavior normal.        Thought Content: Thought content normal.        Judgment: Judgment normal.  Assessment & Plan:   Problem List Items Addressed This Visit      Other   Tobacco abuse   Relevant Orders   CBC   Comprehensive metabolic panel   TSH   Lipid Profile    Other Visit Diagnoses    Encounter to establish care    -  Primary   Relevant Orders   CBC   Comprehensive metabolic panel   TSH   Lipid Profile   Morbid obesity (HCC)       Relevant Orders   CBC   Comprehensive metabolic panel   TSH   Lipid Profile   Depression with anxiety       Relevant Medications   sertraline (ZOLOFT) 50 MG tablet   Other Relevant Orders   Ambulatory referral to Psychiatry   CBC   Comprehensive metabolic panel   TSH   Lipid Profile   Right carpal tunnel syndrome       Relevant Medications   sertraline (ZOLOFT) 50 MG tablet   Low grade squamous intraepithelial lesion (LGSIL) on cervical Pap smear          Encounter to establish care: Care established -Check routine labs such as CBC, CMP, lipid panel and TSH.  Depression with anxiety: PHQ-9 score: 9.  Patient denied suicidal or homicidal thoughts.  Start patient on Zoloft 50 mg once daily.  Discussed about compliance and she verbalized understanding.  Referred patient to psychiatry as per her request.  Right carpal tunnel syndrome: Chronic. -Advised use of wrist splint especially at nighttime  Morbid obesity: BMI of 36 -Encourage exercise, weight loss and dietary modification.  Continue Adipex as prescribed by OB/GYN.  Tobacco abuse: Asked:confirms currently smokes cigarettes Assess: Unwilling to quit but cutting back Advise: needs to QUIT to reduce risk of cancer, cardio and cerebrovascular disease Assist: counseled for 5 minutes and literature provided Arrange: follow up in 3  months  Abnormal Pap smear with LSIL: In 2019.  Reviewed Pap smear result.  Patient has a scheduled appointment with her OB/GYN on August 26 for repeat Pap smear.  Outpatient Encounter Medications as of 08/06/2020  Medication Sig  . acetaminophen (TYLENOL) 500 MG tablet Take 500 mg by mouth every 6 (six) hours as needed.  Marland Kitchen aspirin-acetaminophen-caffeine (EXCEDRIN MIGRAINE) 250-250-65 MG tablet Take 2 tablets by mouth as needed for headache.  . Melatonin 10 MG CAPS Take 10 mg by mouth at bedtime as needed.  . phentermine (ADIPEX-P) 37.5 MG tablet Take 1 tablet (37.5 mg total) by mouth daily before breakfast.  . sertraline (ZOLOFT) 50 MG tablet Take 1 tablet (50 mg total) by mouth daily.   No facility-administered encounter medications on file as of 08/06/2020.    Follow-up: Return in about 3 months (around 11/06/2020).   Ollen Bowl, MD

## 2020-08-12 LAB — LIPID PANEL
Chol/HDL Ratio: 3.4 ratio (ref 0.0–4.4)
Cholesterol, Total: 189 mg/dL (ref 100–199)
HDL: 56 mg/dL (ref >39)
LDL Chol Calc (NIH): 122 mg/dL — ABNORMAL HIGH (ref 0–99)
Triglycerides: 61 mg/dL (ref 0–149)
VLDL Cholesterol Cal: 11 mg/dL (ref 5–40)

## 2020-08-12 LAB — CBC
Hematocrit: 42.6 % (ref 34.0–46.6)
Hemoglobin: 14.8 g/dL (ref 11.1–15.9)
MCH: 32.6 pg (ref 26.6–33.0)
MCHC: 34.7 g/dL (ref 31.5–35.7)
MCV: 94 fL (ref 79–97)
Platelets: 293 10*3/uL (ref 150–450)
RBC: 4.54 x10E6/uL (ref 3.77–5.28)
RDW: 12 % (ref 11.7–15.4)
WBC: 10.8 10*3/uL (ref 3.4–10.8)

## 2020-08-12 LAB — TSH: TSH: 0.689 u[IU]/mL (ref 0.450–4.500)

## 2020-08-19 ENCOUNTER — Encounter: Payer: Self-pay | Admitting: Adult Health

## 2020-08-19 ENCOUNTER — Ambulatory Visit (INDEPENDENT_AMBULATORY_CARE_PROVIDER_SITE_OTHER): Payer: Medicaid Other | Admitting: Adult Health

## 2020-08-19 ENCOUNTER — Other Ambulatory Visit (HOSPITAL_COMMUNITY)
Admission: RE | Admit: 2020-08-19 | Discharge: 2020-08-19 | Disposition: A | Discharge status: Home / Self Care | Payer: Medicaid Other | Source: Ambulatory Visit | Attending: Adult Health | Admitting: Adult Health

## 2020-08-19 VITALS — BP 140/82 | HR 88 | Ht 61.0 in | Wt 190.0 lb

## 2020-08-19 DIAGNOSIS — F32A Depression, unspecified: Secondary | ICD-10-CM

## 2020-08-19 DIAGNOSIS — F329 Major depressive disorder, single episode, unspecified: Secondary | ICD-10-CM

## 2020-08-19 DIAGNOSIS — Z124 Encounter for screening for malignant neoplasm of cervix: Secondary | ICD-10-CM | POA: Insufficient documentation

## 2020-08-19 DIAGNOSIS — Z713 Dietary counseling and surveillance: Secondary | ICD-10-CM | POA: Diagnosis not present

## 2020-08-19 DIAGNOSIS — Z8742 Personal history of other diseases of the female genital tract: Secondary | ICD-10-CM | POA: Diagnosis not present

## 2020-08-19 DIAGNOSIS — Z6835 Body mass index (BMI) 35.0-35.9, adult: Secondary | ICD-10-CM | POA: Diagnosis not present

## 2020-08-19 DIAGNOSIS — Z01419 Encounter for gynecological examination (general) (routine) without abnormal findings: Secondary | ICD-10-CM

## 2020-08-19 MED ORDER — PHENTERMINE HCL 37.5 MG PO TABS: 37.5000 mg | ORAL_TABLET | Freq: Every day | ORAL | 0 refills | Status: DC

## 2020-08-19 NOTE — Progress Notes (Signed)
Patient ID: Megan Contreras, female   DOB: 1986-05-28, 34 y.o.   MRN: [id number] History of Present Illness: Megan Contreras is a 34 year old white female, separated, with SO, they live together now, G2P2, in for well woman gyn exam and pap. Her lst pap was LSIL+HPV 08/30/18, and she had colpo, 09/26/18 with no visible lesions.  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  She has established care with Dr Doristine Bosworth recently.   Current Medications, Allergies, Past Medical History, Past Surgical History, Family History and Social History were reviewed in [employer] record.     Review of Systems: Patient denies any headaches, hearing loss, fatigue, blurred vision, shortness of breath, chest pain, abdominal pain, problems with bowel movements, urination, or intercourse. No joint pain or mood swings.    Physical Exam:BP 140/82 (BP Location: Left Arm, Patient Position: Sitting, Cuff Size: Normal)   Pulse 88   Ht 5' 1"  (1.549 m)   Wt 190 lb (86.2 kg)   LMP 08/12/2020 (Exact Date)   BMI 35.90 kg/m Has lost 4 1/2 lbs. General:  Well developed, well nourished, no acute distress Skin:  Warm and dry Neck:  Midline trachea, normal thyroid, good ROM, no lymphadenopathy Lungs; Clear to auscultation bilaterally Breast:  No dominant palpable mass, retraction, or nipple discharge Cardiovascular: Regular rate and rhythm Abdomen:  Soft, non tender, no hepatosplenomegaly Pelvic:  External genitalia is normal in appearance, no lesions.  The vagina is normal in appearance. Urethra has no lesions or masses. The cervix is bulbous.Pap with GC/CHL and high risk HPV genotyping performed.  Uterus is felt to be normal size, shape, and contour.  No adnexal masses or tenderness noted.Bladder is non tender, no masses felt. Extremities/musculoskeletal:  No swelling or varicosities noted, no clubbing or cyanosis Psych:  No mood changes, alert and cooperative,seems happy AA is 3 Fall risk is low PHQ 9 score is 14, is on Zoloft,  and doing better she says   [employer] - 08/19/20 7579      Pregnancy Intention Screening   Does the patient want to become pregnant in the next year? No    Does the patient's partner want to become pregnant in the next year? No    Would the patient like to discuss contraceptive options today? No      Contraception Wrap Up   Current Method Female Condom    End Method Female Condom    Contraception Counseling Provided No         Examination chaperoned by Dwyane Dee LPN.   Impression and Plan: 1. Routine cervical smear Pap sent  2. Encounter for gynecological examination with Papanicolaou smear of cervix Pap sent Physical in 1 year  Pap in 3 I normal   3. Weight loss counseling, encounter for Continue weight loss and will refill adipex Meds ordered this encounter  Medications  . phentermine (ADIPEX-P) 37.5 MG tablet    Sig: Take 1 tablet (37.5 mg total) by mouth daily before breakfast.    Dispense:  30 tablet    Refill:  0    Order Specific Question:   Supervising Provider    Answer:   Elonda Husky, LUTHER H [[id number]]    4. Body mass index 35.0-35.9, adult   5. History of abnormal cervical Pap smear Pap sent   6. Depression, unspecified depression type On Zoloft from Dr Doristine Bosworth

## 2020-08-20 LAB — CYTOLOGY - PAP
Adequacy: ABSENT
Chlamydia: NEGATIVE
Comment: NEGATIVE
Comment: NEGATIVE
Comment: NORMAL
Diagnosis: NEGATIVE
High risk HPV: NEGATIVE
Neisseria Gonorrhea: NEGATIVE

## 2020-09-30 ENCOUNTER — Ambulatory Visit: Payer: Medicaid Other | Admitting: Adult Health

## 2023-05-03 ENCOUNTER — Encounter: Payer: Self-pay | Admitting: Emergency Medicine

## 2023-05-03 ENCOUNTER — Other Ambulatory Visit: Payer: Self-pay

## 2023-05-03 ENCOUNTER — Ambulatory Visit
Admission: EM | Admit: 2023-05-03 | Discharge: 2023-05-03 | Disposition: A | Discharge status: Home / Self Care | Payer: Medicaid Other | Attending: Nurse Practitioner | Admitting: Nurse Practitioner

## 2023-05-03 DIAGNOSIS — J029 Acute pharyngitis, unspecified: Secondary | ICD-10-CM | POA: Diagnosis not present

## 2023-05-03 DIAGNOSIS — R590 Localized enlarged lymph nodes: Secondary | ICD-10-CM | POA: Diagnosis not present

## 2023-05-03 LAB — POCT RAPID STREP A (OFFICE): Rapid Strep A Screen: NEGATIVE

## 2023-05-03 MED ORDER — AZITHROMYCIN 500 MG PO TABS: 500.0000 mg | ORAL_TABLET | Freq: Every day | ORAL | 0 refills | Status: AC

## 2023-05-03 NOTE — Discharge Instructions (Signed)
The rapid strep throat test today is negative.  We are sending for a throat culture.  I believe you have strep throat based on your symptoms and exam today.  Take the azithromycin as prescribed to treat it.  We may call you Monday and tell you stop the antibiotic if the throat culture is negative.

## 2023-05-03 NOTE — ED Provider Notes (Signed)
[facility]    CSN: [id number] Arrival date & time: 05/03/23  0813      History   Chief Complaint Chief Complaint  Patient presents with   Sore Throat    HPI Megan Contreras is a 37 y.o. female.   Patient presents today with 1 day history of bodyaches, dry cough, shortness of breath and occasional center chest tightness, runny and stuffy nose, pain with swallowing, sore throat, white spots in her throat, right-sided headache and right-sided ear pain, decreased appetite because it hurts to swallow, and fatigue.  She denies known fever, chills, congested cough, current chest pain, postnasal drainage, abdominal pain, nausea/vomiting, and diarrhea.  No new rash.  No known sick contact.  Has not taken anything for symptoms so far.  Patient reports history of seasonal allergies.  She does not take anything for allergies at this time.  Patient denies antibiotic use in the past 90 days.  She reports history of hive like reaction to amoxicillin as a teenager.    Past Medical History:  Diagnosis Date   Abnormal Pap smear    had HPV   Abnormal Papanicolaou smear of cervix with positive human papilloma virus (HPV) test 09/03/2018   LSIL+HPV get colpo___   Anxiety    Phreesia 08/03/2020   Depression    GERD (gastroesophageal reflux disease)    History of abnormal Pap smear    Pregnant 07/22/2013   Vaginal Pap smear, abnormal     Patient Active Problem List   Diagnosis Date Noted   Body mass index 35.0-35.9, adult 08/19/2020   Abnormal Papanicolaou smear of cervix with positive human papilloma virus (HPV) test 09/03/2018   Body mass index 34.0-34.9, adult 08/30/2018   Routine cervical smear 08/30/2018   History of abnormal cervical Pap smear 08/30/2018   Encounter for gynecological examination with Papanicolaou smear of cervix 08/30/2018   Cough 11/12/2017   Congestion of nasal sinus 11/12/2017   Body mass index 30.0-30.9, adult 10/15/2017   Weight loss counseling,  encounter for 10/15/2017   Situational mixed anxiety and depressive disorder 09/05/2017   Tobacco abuse 11/30/2016   Obese 11/30/2016   Depression 04/26/2013   LGSIL on Pap smear of cervix 04/26/2013    Past Surgical History:  Procedure Laterality Date   DENTAL EXAMINATION UNDER ANESTHESIA     LEEP      OB History     Gravida  2   Para  2   Term  2   Preterm      AB      Living  2      SAB      IAB      Ectopic      Multiple      Live Births  2            Home Medications    Prior to Admission medications   Medication Sig Start Date End Date Taking? Authorizing Provider  azithromycin (ZITHROMAX) 500 MG tablet Take 1 tablet (500 mg total) by mouth daily for 5 days. 05/03/23 05/08/23 Yes Valentino Nose, NP    Family History Family History  Problem Relation Age of Onset   Heart disease Paternal Grandfather    Diabetes Mother    Depression Mother    Drug abuse Mother    Hyperlipidemia Mother    Depression Father    Heart disease Son        tetralogy of fallot   Hypertension Other    Cancer Other  Social History Social History   Tobacco Use   Smoking status: Former    Packs/day: 0.25    Years: 16.00    Additional pack years: 0.00    Total pack years: 4.00    Types: Cigarettes   Smokeless tobacco: Never   Tobacco comments:    smokes 4-5 cig daily  Vaping Use   Vaping Use: Some days  Substance Use Topics   Alcohol use: Yes    Comment: socially   Drug use: No     Allergies   Amoxicillin   Review of Systems Review of Systems Per HPI  Physical Exam Triage Vital Signs ED Triage Vitals  Enc Vitals Group     BP 05/03/23 0837 129/88     Pulse Rate 05/03/23 0837 92     Resp 05/03/23 0837 20     Temp 05/03/23 0837 98.4 F (36.9 C)     Temp Source 05/03/23 0837 Oral     SpO2 05/03/23 0837 96 %     Weight --      Height --      Head Circumference --      Peak Flow --      Pain Score 05/03/23 0834 6     Pain Loc --       Pain Edu? --      Excl. in GC? --    No data found.  Updated Vital Signs BP 129/88 (BP Location: Right Arm)   Pulse 92   Temp 98.4 F (36.9 C) (Oral)   Resp 20   LMP 04/22/2023 (Approximate)   SpO2 96%   Visual Acuity Right Eye Distance:   Left Eye Distance:   Bilateral Distance:    Right Eye Near:   Left Eye Near:    Bilateral Near:     Physical Exam Vitals and nursing note reviewed.  Constitutional:      General: She is not in acute distress.    Appearance: She is well-developed. She is not toxic-appearing.  HENT:     Head: Normocephalic and atraumatic.     Right Ear: Ear canal normal. No drainage, swelling or tenderness. A middle ear effusion is present. Tympanic membrane is not erythematous.     Left Ear: Ear canal normal. No drainage, swelling or tenderness. A middle ear effusion is present. Tympanic membrane is not erythematous.     Nose: No congestion or rhinorrhea.     Mouth/Throat:     Mouth: Mucous membranes are moist.     Pharynx: Oropharynx is clear. Uvula midline. Posterior oropharyngeal erythema present. No oropharyngeal exudate.     Tonsils: Tonsillar exudate present. 2+ on the right. 2+ on the left.  Eyes:     Extraocular Movements:     Right eye: Normal extraocular motion.     Left eye: Normal extraocular motion.  Cardiovascular:     Rate and Rhythm: Normal rate and regular rhythm.  Pulmonary:     Effort: Pulmonary effort is normal. No respiratory distress.     Breath sounds: Normal breath sounds. No wheezing, rhonchi or rales.  Musculoskeletal:     Cervical back: Normal range of motion and neck supple.  Lymphadenopathy:     Cervical: Cervical adenopathy present.  Skin:    General: Skin is warm and dry.     Capillary Refill: Capillary refill takes less than 2 seconds.     Coloration: Skin is not pale.     Findings: No erythema or rash.  Neurological:  Mental Status: She is alert and oriented to person, place, and time.  Psychiatric:         Behavior: Behavior is cooperative.      UC Treatments / Results  Labs (all labs ordered are listed, but only abnormal results are displayed) Labs Reviewed  CULTURE, GROUP A STREP [facility])  POCT RAPID STREP A (OFFICE)    EKG   Radiology No results found.  Procedures Procedures (including critical care time)  Medications Ordered in UC Medications - No data to display  Initial Impression / Assessment and Plan / UC Course  I have reviewed the triage vital signs and the nursing notes.  Pertinent labs & imaging results that were available during my care of the patient were reviewed by me and considered in my medical decision making (see chart for details).   Patient is well-appearing, normotensive, afebrile, not tachycardic, not tachypneic, oxygenating well on room air.    1. Acute pharyngitis, unspecified etiology 2. Cervical lymphadenopathy Rapid strep throat test today is negative Centor score today is 2 Throat culture is pending Will treat empirically for strep throat with azithromycin daily for 5 days Change toothbrush after starting treatment Supportive care discussed with patient ER and return precautions discussed  The patient was given the opportunity to ask questions.  All questions answered to their satisfaction.  The patient is in agreement to this plan.    Final Clinical Impressions(s) / UC Diagnoses   Final diagnoses:  Acute pharyngitis, unspecified etiology  Cervical lymphadenopathy     Discharge Instructions      The rapid strep throat test today is negative.  We are sending for a throat culture.  I believe you have strep throat based on your symptoms and exam today.  Take the azithromycin as prescribed to treat it.  We may call you Monday and tell you stop the antibiotic if the throat culture is negative.     ED Prescriptions     Medication Sig Dispense Auth. Provider   azithromycin (ZITHROMAX) 500 MG tablet Take 1 tablet (500 mg total) by  mouth daily for 5 days. 5 tablet Valentino Nose, NP      PDMP not reviewed this encounter.   Valentino Nose, NP 05/03/23 [phone]

## 2023-05-03 NOTE — ED Triage Notes (Signed)
Pt reports headache, nausea, sore throat since yesterday. Pt reports "noticed white spots". Denies any fevers.

## 2023-05-05 LAB — CULTURE, GROUP A STREP (THRC)

## 2023-06-14 ENCOUNTER — Ambulatory Visit: Payer: Medicaid Other | Admitting: Adult Health

## 2023-06-25 ENCOUNTER — Other Ambulatory Visit (HOSPITAL_COMMUNITY)
Admission: RE | Admit: 2023-06-25 | Discharge: 2023-06-25 | Disposition: A | Discharge status: Home / Self Care | Payer: Medicaid Other | Source: Ambulatory Visit | Attending: Adult Health | Admitting: Adult Health

## 2023-06-25 ENCOUNTER — Ambulatory Visit (INDEPENDENT_AMBULATORY_CARE_PROVIDER_SITE_OTHER): Payer: Medicaid Other | Admitting: Adult Health

## 2023-06-25 ENCOUNTER — Encounter: Payer: Self-pay | Admitting: Adult Health

## 2023-06-25 VITALS — BP 113/76 | HR 83 | Ht 62.0 in | Wt 189.0 lb

## 2023-06-25 DIAGNOSIS — N943 Premenstrual tension syndrome: Secondary | ICD-10-CM | POA: Diagnosis not present

## 2023-06-25 DIAGNOSIS — Z0000 Encounter for general adult medical examination without abnormal findings: Secondary | ICD-10-CM | POA: Insufficient documentation

## 2023-06-25 DIAGNOSIS — N920 Excessive and frequent menstruation with regular cycle: Secondary | ICD-10-CM

## 2023-06-25 DIAGNOSIS — F32A Depression, unspecified: Secondary | ICD-10-CM

## 2023-06-25 DIAGNOSIS — Z01419 Encounter for gynecological examination (general) (routine) without abnormal findings: Secondary | ICD-10-CM

## 2023-06-25 DIAGNOSIS — F419 Anxiety disorder, unspecified: Secondary | ICD-10-CM

## 2023-06-25 MED ORDER — FLUOXETINE HCL 20 MG PO CAPS: 20.0000 mg | ORAL_CAPSULE | Freq: Every day | ORAL | 3 refills | Status: DC

## 2023-06-25 NOTE — Progress Notes (Signed)
Patient ID: Megan Contreras, female   DOB: 1986-05-11, 37 y.o.   MRN: [id number] History of Present Illness: Megan Contreras is a 37 year old white female, separated about 9 years and has partner of about 5 years, in for a well woman gyn exam and pap.     Current Medications, Allergies, Past Medical History, Past Surgical History, Family History and Social History were reviewed in [employer] record.     Review of Systems: Patient denies any hearing loss, fatigue, blurred vision, shortness of breath, chest pain, abdominal pain, problems with bowel movements, urination, or intercourse. No joint pain or mood swings.  Has headaches, but not new or different, periods last about 7 days with 3 heavy days, may change pad every 3 hours, some clots Has PMS, teary around periods.   Physical Exam:BP 113/76 (BP Location: Left Arm, Patient Position: Sitting, Cuff Size: Normal)   Pulse 83   Ht 5\' 2"  (1.575 m)   Wt 189 lb (85.7 kg)   LMP 06/08/2023 (Approximate)   BMI 34.57 kg/m   General:  Well developed, well nourished, no acute distress Skin:  Warm and dry Neck:  Midline trachea, normal thyroid, good ROM, no lymphadenopathy Lungs; Clear to auscultation bilaterally Breast:  No dominant palpable mass, retraction, or nipple discharge Cardiovascular: Regular rate and rhythm Abdomen:  Soft, non tender, no hepatosplenomegaly Pelvic:  External genitalia is normal in appearance, no lesions.  The vagina is normal in appearance. Urethra has no lesions or masses. The cervix is bulbous. Pap with GC/CHL and HR HPV genotyping performed. Uterus is felt to be normal size, shape, and contour.  No adnexal masses or tenderness noted.Bladder is non tender, no masses felt. Extremities/musculoskeletal:  No swelling or varicosities noted, no clubbing or cyanosis Psych:  No mood changes, alert and cooperative,seems happy AA is 3 Fall risk is low    06/25/2023   11:39 AM 08/19/2020    9:25 AM 08/06/2020    10:35 AM  Depression screen PHQ 2/9  Decreased Interest 1 1 1   Down, Depressed, Hopeless 2 2 1   PHQ - 2 Score 3 3 2   Altered sleeping 2 3 2   Tired, decreased energy 2 3 1   Change in appetite 2 1 1   Feeling bad or failure about yourself  2 2 1   Trouble concentrating 2 2 1   Moving slowly or fidgety/restless 1 0 1  Suicidal thoughts 1 0 0  PHQ-9 Score 15 14 9   Difficult doing work/chores  Somewhat difficult        06/25/2023   11:40 AM 08/19/2020    9:26 AM  GAD 7 : Generalized Anxiety Score  Nervous, Anxious, on Edge 1 3  Control/stop worrying 1 3  Worry too much - different things 1 3  Trouble relaxing 1 3  Restless 1 3  Easily annoyed or irritable 1 3  Afraid - awful might happen 1 2  Total GAD 7 Score 7 20  Anxiety Difficulty  Somewhat difficult    Examination chaperoned by Malachy Mood LPN   Impression and Plan: 1. Routine general medical examination at a health care facility Pap sent Pap in 3 years if normal Will check labs - Cytology - PAP( [place]) - CBC - Comprehensive metabolic panel - TSH - Lipid panel  2. Menorrhagia with regular cycle Periods heavy but she declines birth control  3. PMS (premenstrual syndrome) +PMS, teary around period Will rx prozac 20 mg 1 daily    4. Anxiety and depression  Will rx prozac 20 mg 1 daily Meds ordered this encounter  Medications   FLUoxetine (PROZAC) 20 MG capsule    Sig: Take 1 capsule (20 mg total) by mouth daily.    Dispense:  30 capsule    Refill:  3    Order Specific Question:   Supervising Provider    Answer:   Duane Lope H [[id number]]   Follow up in 3 months for ROS   5. Encounter for gynecological examination with Papanicolaou smear of cervix Pap sent Pap in 3 years if normal Physical in 1 year

## 2023-06-26 LAB — COMPREHENSIVE METABOLIC PANEL
ALT: 22 IU/L (ref 0–32)
AST: 17 IU/L (ref 0–40)
Albumin: 4.5 g/dL (ref 3.9–4.9)
Alkaline Phosphatase: 75 IU/L (ref 44–121)
BUN/Creatinine Ratio: 12 (ref 9–23)
BUN: 8 mg/dL (ref 6–20)
Bilirubin Total: 0.5 mg/dL (ref 0.0–1.2)
CO2: 24 mmol/L (ref 20–29)
Calcium: 9.3 mg/dL (ref 8.7–10.2)
Chloride: 105 mmol/L (ref 96–106)
Creatinine, Ser: 0.65 mg/dL (ref 0.57–1.00)
Globulin, Total: 2.2 g/dL (ref 1.5–4.5)
Glucose: 93 mg/dL (ref 70–99)
Potassium: 4.6 mmol/L (ref 3.5–5.2)
Sodium: 144 mmol/L (ref 134–144)
Total Protein: 6.7 g/dL (ref 6.0–8.5)
eGFR: 118 mL/min/{1.73_m2} (ref >59)

## 2023-06-26 LAB — CYTOLOGY - PAP
Adequacy: ABSENT
Chlamydia: NEGATIVE
Comment: NEGATIVE
Comment: NEGATIVE
Comment: NORMAL
Diagnosis: NEGATIVE
High risk HPV: NEGATIVE
Neisseria Gonorrhea: NEGATIVE

## 2023-06-26 LAB — LIPID PANEL
Chol/HDL Ratio: 2.7 ratio (ref 0.0–4.4)
Cholesterol, Total: 173 mg/dL (ref 100–199)
HDL: 63 mg/dL (ref >39)
LDL Chol Calc (NIH): 89 mg/dL (ref 0–99)
Triglycerides: 119 mg/dL (ref 0–149)
VLDL Cholesterol Cal: 21 mg/dL (ref 5–40)

## 2023-06-26 LAB — CBC
Hematocrit: 44 % (ref 34.0–46.6)
Hemoglobin: 14.3 g/dL (ref 11.1–15.9)
MCH: 30.5 pg (ref 26.6–33.0)
MCHC: 32.5 g/dL (ref 31.5–35.7)
MCV: 94 fL (ref 79–97)
Platelets: 313 10*3/uL (ref 150–450)
RBC: 4.69 x10E6/uL (ref 3.77–5.28)
RDW: 11.9 % (ref 11.7–15.4)
WBC: 12.7 10*3/uL — ABNORMAL HIGH (ref 3.4–10.8)

## 2023-06-26 LAB — TSH: TSH: 0.445 u[IU]/mL — ABNORMAL LOW (ref 0.450–4.500)

## 2023-09-25 ENCOUNTER — Encounter: Payer: Self-pay | Admitting: Adult Health

## 2023-09-25 ENCOUNTER — Ambulatory Visit: Payer: Medicaid Other | Admitting: Adult Health

## 2023-09-25 VITALS — BP 116/80 | HR 67 | Ht 61.0 in | Wt 191.0 lb

## 2023-09-25 DIAGNOSIS — F419 Anxiety disorder, unspecified: Secondary | ICD-10-CM

## 2023-09-25 DIAGNOSIS — F32A Depression, unspecified: Secondary | ICD-10-CM

## 2023-09-25 DIAGNOSIS — N943 Premenstrual tension syndrome: Secondary | ICD-10-CM

## 2023-09-25 MED ORDER — FLUOXETINE HCL 40 MG PO CAPS: 40.0000 mg | ORAL_CAPSULE | Freq: Every day | ORAL | 6 refills | Status: DC

## 2023-09-25 NOTE — Progress Notes (Signed)
Subjective:     Patient ID: Megan Contreras, female   DOB: [birth year], 37 y.o.   MRN: [id number]  HPI Megan Contreras is a 37 year old white female, separated, G2P2002, back in follow up in starting Prozac 20 mg in July and is feeling better, still has PMS, and thinks dose needs increasing. 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     Component Value Date/Time   DIAGPAP  06/25/2023 1139    - Negative for intraepithelial lesion or malignancy (NILM)   DIAGPAP  08/19/2020 0920    - Negative for intraepithelial lesion or malignancy (NILM)   DIAGPAP (A) 08/30/2018 0000    LOW GRADE SQUAMOUS INTRAEPITHELIAL LESION: CIN-1/ HPV (LSIL).   HPVHIGH Negative 06/25/2023 1139   HPVHIGH Negative 08/19/2020 0920   ADEQPAP  06/25/2023 1139    Satisfactory for evaluation; transformation zone component ABSENT.   ADEQPAP  08/19/2020 0920    Satisfactory for evaluation; transformation zone component ABSENT.   ADEQPAP (A) 08/30/2018 0000    Satisfactory for evaluation  endocervical/transformation zone component PRESENT.   Review of Systems Feels better +PMS Reviewed past medical,surgical, social and family history. Reviewed medications and allergies.     Objective:   Physical Exam BP 116/80 (BP Location: Left Arm, Patient Position: Sitting, Cuff Size: Large)   Pulse 67   Ht 5\' 1"  (1.549 m)   Wt 191 lb (86.6 kg)   LMP 09/22/2023 (Exact Date)   BMI 36.09 kg/m     Skin warm and dry.  Lungs: clear to ausculation bilaterally. Cardiovascular: regular rate and rhythm.  Fall riks is low    09/25/2023   10:53 AM 06/25/2023   11:39 AM 08/19/2020    9:25 AM  Depression screen PHQ 2/9  Decreased Interest 1 1 1   Down, Depressed, Hopeless 1 2 2   PHQ - 2 Score 2 3 3   Altered sleeping 1 2 3   Tired, decreased energy 1 2 3   Change in appetite 1 2 1   Feeling bad or failure about yourself  2 2 2   Trouble concentrating 2  2 2   Moving slowly or fidgety/restless 0 1 0  Suicidal thoughts 0 1 0  PHQ-9 Score 9 15 14   Difficult doing work/chores   Somewhat difficult       09/25/2023   10:55 AM 06/25/2023   11:40 AM 08/19/2020    9:26 AM  GAD 7 : Generalized Anxiety Score  Nervous, Anxious, on Edge 1 1 3   Control/stop worrying 2 1 3   Worry too much - different things 2 1 3   Trouble relaxing 2 1 3   Restless 2 1 3   Easily annoyed or irritable 3 1 3   Afraid - awful might happen 1 1 2   Total GAD 7 Score 13 7 20   Anxiety Difficulty Somewhat difficult  Somewhat difficult    Upstream - 09/25/23 1052       Pregnancy Intention Screening   Does the patient want to become pregnant in the next year? No    Would the patient like to discuss contraceptive options today? No      Contraception Wrap Up   Current Method Withdrawal or Other Method   monitor ovulation symptoms   End Method Withdrawal or Other Method    Contraception Counseling Provided No               Assessment:  1. Anxiety and depression Depression is better, still anxious Take 1 1/2 tablet of Prozac for 1 week then increase to 40 mg  Meds ordered this encounter  Medications   FLUoxetine (PROZAC) 40 MG capsule    Sig: Take 1 capsule (40 mg total) by mouth daily.    Dispense:  30 capsule    Refill:  6    Order Specific Question:   Supervising Provider    Answer:   Despina Hidden, LUTHER H [[id number]]     2. PMS (premenstrual syndrome) +PMS still     Plan:     Follow up in 8 weeks with me for ROS

## 2023-11-20 ENCOUNTER — Encounter: Payer: Self-pay | Admitting: Adult Health

## 2023-11-20 ENCOUNTER — Ambulatory Visit: Payer: Medicaid Other | Admitting: Adult Health

## 2023-11-20 VITALS — BP 110/74 | HR 83 | Ht 61.0 in | Wt 201.0 lb

## 2023-11-20 DIAGNOSIS — N943 Premenstrual tension syndrome: Secondary | ICD-10-CM

## 2023-11-20 DIAGNOSIS — F419 Anxiety disorder, unspecified: Secondary | ICD-10-CM

## 2023-11-20 DIAGNOSIS — F32A Depression, unspecified: Secondary | ICD-10-CM | POA: Diagnosis not present

## 2023-11-20 MED ORDER — HYDROXYZINE HCL 10 MG PO TABS: 10.0000 mg | ORAL_TABLET | Freq: Three times a day (TID) | ORAL | 3 refills | Status: DC | PRN

## 2023-11-20 NOTE — Progress Notes (Signed)
Subjective:     Patient ID: Megan Contreras, female   DOB: 02/06/1986, 37 y.o.   MRN: [id number]  HPI Megan Contreras is a 37 year old white female, with SO, G2P2002, back in follow up on increasing prozac to 40 mg and is better, but still anxious at times, gets hot and has night sweats too.     Component Value Date/Time   DIAGPAP  06/25/2023 1139    - Negative for intraepithelial lesion or malignancy (NILM)   DIAGPAP  08/19/2020 0920    - Negative for intraepithelial lesion or malignancy (NILM)   DIAGPAP (A) 08/30/2018 0000    LOW GRADE SQUAMOUS INTRAEPITHELIAL LESION: CIN-1/ HPV (LSIL).   HPVHIGH Negative 06/25/2023 1139   HPVHIGH Negative 08/19/2020 0920   ADEQPAP  06/25/2023 1139    Satisfactory for evaluation; transformation zone component ABSENT.   ADEQPAP  08/19/2020 0920    Satisfactory for evaluation; transformation zone component ABSENT.   ADEQPAP (A) 08/30/2018 0000    Satisfactory for evaluation  endocervical/transformation zone component PRESENT.   Review of Systems Doing better +anxious at times Has hot flashes and night sweats at times PMS is better    Reviewed past medical,surgical, social and family history. Reviewed medications and allergies.  Objective:   Physical Exam BP 110/74 (BP Location: Left Arm, Patient Position: Sitting, Cuff Size: Normal)   Pulse 83   Ht 5\' 1"  (1.549 m)   Wt 201 lb (91.2 kg)   LMP 11/14/2023   BMI 37.98 kg/m     Skin warm and dry. Lungs: clear to ausculation bilaterally. Cardiovascular: regular rate and rhythm.  Fall risk is low    11/20/2023   11:11 AM 09/25/2023   10:53 AM 06/25/2023   11:39 AM  Depression screen PHQ 2/9  Decreased Interest 0 1 1  Down, Depressed, Hopeless 0 1 2  PHQ - 2 Score 0 2 3  Altered sleeping 1 1 2   Tired, decreased energy 0 1 2  Change in appetite 0 1 2  Feeling bad or failure about yourself  1 2 2   Trouble concentrating 1 2 2   Moving slowly or fidgety/restless 0 0 1  Suicidal thoughts 0 0 1  PHQ-9  Score 3 9 15   Difficult doing work/chores Somewhat difficult         11/20/2023   11:13 AM 09/25/2023   10:55 AM 06/25/2023   11:40 AM 08/19/2020    9:26 AM  GAD 7 : Generalized Anxiety Score  Nervous, Anxious, on Edge 1 1 1 3   Control/stop worrying 1 2 1 3   Worry too much - different things 1 2 1 3   Trouble relaxing 1 2 1 3   Restless 1 2 1 3   Easily annoyed or irritable 1 3 1 3   Afraid - awful might happen 0 1 1 2   Total GAD 7 Score 6 13 7 20   Anxiety Difficulty Somewhat difficult Somewhat difficult  Somewhat difficult      Upstream - 11/20/23 1122       Pregnancy Intention Screening   Does the patient want to become pregnant in the next year? No    Does the patient's partner want to become pregnant in the next year? No    Would the patient like to discuss contraceptive options today? No      Contraception Wrap Up   Current Method Withdrawal or Other Method    End Method Withdrawal or Other Method  Assessment:     1. Anxiety and depression Feels better but still anxious at times, will continue Prozac 40 mg and add vistaril 10 mg 1 tid prn Has refills on Prozac  Meds ordered this encounter  Medications   hydrOXYzine (ATARAX) 10 MG tablet    Sig: Take 1 tablet (10 mg total) by mouth 3 (three) times daily as needed.    Dispense:  30 tablet    Refill:  3    Order Specific Question:   Supervising Provider    Answer:   Despina Hidden, LUTHER H [[id number]]     2. PMS (premenstrual syndrome) Better, will continue Prozac 40 mg 1 daily     Plan:     Follow up in 3 months for ROS or sooner if needed

## 2024-02-20 ENCOUNTER — Encounter: Payer: Self-pay | Admitting: Adult Health

## 2024-02-20 ENCOUNTER — Ambulatory Visit: Payer: Medicaid Other | Admitting: Adult Health

## 2024-02-20 VITALS — BP 123/83 | HR 87 | Ht 61.0 in | Wt 216.0 lb

## 2024-02-20 DIAGNOSIS — Z1331 Encounter for screening for depression: Secondary | ICD-10-CM | POA: Diagnosis not present

## 2024-02-20 DIAGNOSIS — F419 Anxiety disorder, unspecified: Secondary | ICD-10-CM

## 2024-02-20 DIAGNOSIS — N943 Premenstrual tension syndrome: Secondary | ICD-10-CM

## 2024-02-20 DIAGNOSIS — F32A Depression, unspecified: Secondary | ICD-10-CM | POA: Diagnosis not present

## 2024-02-20 MED ORDER — HYDROXYZINE HCL 10 MG PO TABS: 10.0000 mg | ORAL_TABLET | Freq: Three times a day (TID) | ORAL | 3 refills | Status: DC | PRN

## 2024-02-20 MED ORDER — FLUOXETINE HCL 40 MG PO CAPS: 40.0000 mg | ORAL_CAPSULE | Freq: Every day | ORAL | 6 refills | Status: DC

## 2024-02-20 NOTE — Progress Notes (Signed)
 Subjective:     Patient ID: Megan Contreras, female   DOB: 1986/02/17, 38 y.o.   MRN: [id number]  HPI Megan Contreras is a 38 year old white female, single, G2P2002, back in follow up on taking prozac 40 mg and atarax prn for depression, anxiety and PMS, and she says she it better, but having relationship issues at persent.     Component Value Date/Time   DIAGPAP  06/25/2023 1139    - Negative for intraepithelial lesion or malignancy (NILM)   DIAGPAP  08/19/2020 0920    - Negative for intraepithelial lesion or malignancy (NILM)   DIAGPAP (A) 08/30/2018 0000    LOW GRADE SQUAMOUS INTRAEPITHELIAL LESION: CIN-1/ HPV (LSIL).   HPVHIGH Negative 06/25/2023 1139   HPVHIGH Negative 08/19/2020 0920   ADEQPAP  06/25/2023 1139    Satisfactory for evaluation; transformation zone component ABSENT.   ADEQPAP  08/19/2020 0920    Satisfactory for evaluation; transformation zone component ABSENT.   ADEQPAP (A) 08/30/2018 0000    Satisfactory for evaluation  endocervical/transformation zone component PRESENT.    Review of Systems Anxiety and depression and PMS better Reviewed past medical,surgical, social and family history. Reviewed medications and allergies.     Objective:   Physical Exam BP 123/83 (BP Location: Left Arm, Patient Position: Sitting, Cuff Size: Normal)   Pulse 87   Ht 5\' 1"  (1.549 m)   Wt 216 lb (98 kg)   LMP 01/31/2024   BMI 40.81 kg/m     Skin warm and dry.  Lungs: clear to ausculation bilaterally. Cardiovascular: regular rate and rhythm.  Fall risk is low    02/20/2024   10:59 AM 11/20/2023   11:11 AM 09/25/2023   10:53 AM  Depression screen PHQ 2/9  Decreased Interest 1 0 1  Down, Depressed, Hopeless 1 0 1  PHQ - 2 Score 2 0 2  Altered sleeping 2 1 1   Tired, decreased energy 2 0 1  Change in appetite 2 0 1  Feeling bad or failure about yourself  1 1 2   Trouble concentrating 1 1 2   Moving slowly or fidgety/restless 0 0 0  Suicidal thoughts 0 0 0  PHQ-9 Score 10 3 9    Difficult doing work/chores  Somewhat difficult        02/20/2024   11:01 AM 11/20/2023   11:13 AM 09/25/2023   10:55 AM 06/25/2023   11:40 AM  GAD 7 : Generalized Anxiety Score  Nervous, Anxious, on Edge 1 1 1 1   Control/stop worrying 1 1 2 1   Worry too much - different things 1 1 2 1   Trouble relaxing 1 1 2 1   Restless 1 1 2 1   Easily annoyed or irritable 2 1 3 1   Afraid - awful might happen 0 0 1 1  Total GAD 7 Score 7 6 13 7   Anxiety Difficulty  Somewhat difficult Somewhat difficult     Upstream - 02/20/24 1058       Pregnancy Intention Screening   Does the patient want to become pregnant in the next year? No    Does the patient's partner want to become pregnant in the next year? No    Would the patient like to discuss contraceptive options today? No      Contraception Wrap Up   Current Method Abstinence;Withdrawal or Other Method    End Method Withdrawal or Other Method               Assessment:     1. Anxiety  and depression (Primary) She says she feels better, will refill Prozac and atarax Meds ordered this encounter  Medications   FLUoxetine (PROZAC) 40 MG capsule    Sig: Take 1 capsule (40 mg total) by mouth daily.    Dispense:  30 capsule    Refill:  6    Supervising Provider:   Duane Lope H [[id number]]   hydrOXYzine (ATARAX) 10 MG tablet    Sig: Take 1 tablet (10 mg total) by mouth 3 (three) times daily as needed.    Dispense:  30 tablet    Refill:  3    Supervising Provider:   Duane Lope H [[id number]]     2. PMS (premenstrual syndrome) Better on Prozac     Plan:     Return about 06/25/24 for physical and ROS

## 2024-03-04 ENCOUNTER — Other Ambulatory Visit: Payer: Self-pay | Admitting: *Deleted

## 2024-03-04 MED ORDER — FLUOXETINE HCL 40 MG PO CAPS: 40.0000 mg | ORAL_CAPSULE | Freq: Every day | ORAL | 1 refills | Status: DC

## 2024-05-29 ENCOUNTER — Other Ambulatory Visit: Payer: Self-pay | Admitting: Adult Health

## 2024-06-25 ENCOUNTER — Other Ambulatory Visit (HOSPITAL_COMMUNITY)
Admission: RE | Admit: 2024-06-25 | Discharge: 2024-06-25 | Disposition: A | Discharge status: Home / Self Care | Source: Ambulatory Visit | Attending: Adult Health | Admitting: Adult Health

## 2024-06-25 ENCOUNTER — Encounter: Payer: Self-pay | Admitting: Adult Health

## 2024-06-25 ENCOUNTER — Ambulatory Visit: Payer: Medicaid Other | Admitting: Adult Health

## 2024-06-25 VITALS — BP 118/81 | HR 92 | Ht 61.0 in | Wt 231.0 lb

## 2024-06-25 DIAGNOSIS — Z1329 Encounter for screening for other suspected endocrine disorder: Secondary | ICD-10-CM | POA: Diagnosis not present

## 2024-06-25 DIAGNOSIS — F419 Anxiety disorder, unspecified: Secondary | ICD-10-CM | POA: Diagnosis not present

## 2024-06-25 DIAGNOSIS — F32A Depression, unspecified: Secondary | ICD-10-CM | POA: Diagnosis not present

## 2024-06-25 DIAGNOSIS — Z01419 Encounter for gynecological examination (general) (routine) without abnormal findings: Secondary | ICD-10-CM

## 2024-06-25 DIAGNOSIS — Z13220 Encounter for screening for lipoid disorders: Secondary | ICD-10-CM | POA: Diagnosis not present

## 2024-06-25 DIAGNOSIS — Z113 Encounter for screening for infections with a predominantly sexual mode of transmission: Secondary | ICD-10-CM | POA: Insufficient documentation

## 2024-06-25 DIAGNOSIS — Z6841 Body Mass Index (BMI) 40.0 and over, adult: Secondary | ICD-10-CM | POA: Diagnosis not present

## 2024-06-25 DIAGNOSIS — Z713 Dietary counseling and surveillance: Secondary | ICD-10-CM

## 2024-06-25 DIAGNOSIS — Z1331 Encounter for screening for depression: Secondary | ICD-10-CM

## 2024-06-25 MED ORDER — FLUOXETINE HCL 40 MG PO CAPS: 40.0000 mg | ORAL_CAPSULE | Freq: Every day | ORAL | 6 refills | Status: AC

## 2024-06-25 MED ORDER — HYDROXYZINE HCL 10 MG PO TABS: 10.0000 mg | ORAL_TABLET | Freq: Three times a day (TID) | ORAL | 3 refills | Status: AC | PRN

## 2024-06-25 MED ORDER — PHENTERMINE HCL 37.5 MG PO TABS
37.5000 mg | ORAL_TABLET | Freq: Every day | ORAL | 0 refills | Status: DC
Start: 2024-06-25 — End: 2024-07-23

## 2024-06-25 NOTE — Progress Notes (Signed)
 Patient ID: Megan Contreras, female   DOB: 29-Mar-1986, 38 y.o.   MRN: [id number] History of Present Illness: Megan Contreras is a 38 year old white female,separated, G2P2002, in for a well woman gyn exam.  She has gained weight and wants to try phentermine , has used in the past.     Component Value Date/Time   DIAGPAP  06/25/2023 1139    - Negative for intraepithelial lesion or malignancy (NILM)   DIAGPAP  08/19/2020 0920    - Negative for intraepithelial lesion or malignancy (NILM)   DIAGPAP (A) 08/30/2018 0000    LOW GRADE SQUAMOUS INTRAEPITHELIAL LESION: CIN-1/ HPV (LSIL).   HPVHIGH Negative 06/25/2023 1139   HPVHIGH Negative 08/19/2020 0920   ADEQPAP  06/25/2023 1139    Satisfactory for evaluation; transformation zone component ABSENT.   ADEQPAP  08/19/2020 0920    Satisfactory for evaluation; transformation zone component ABSENT.   ADEQPAP (A) 08/30/2018 0000    Satisfactory for evaluation  endocervical/transformation zone component PRESENT.     Current Medications, Allergies, Past Medical History, Past Surgical History, Family History and Social History were reviewed in [employer] record.     Review of Systems: Patient denies any headaches, hearing loss, fatigue, blurred vision, shortness of breath, chest pain, abdominal pain, problems with bowel movements, urination, or intercourse(not active). No joint pain or mood swings.  Has gained weight  Is good on prozac   Physical Exam:BP 118/81 (BP Location: Right Arm, Patient Position: Sitting, Cuff Size: Large)   Pulse 92   Ht 5' 1 (1.549 m)   Wt 231 lb (104.8 kg)   LMP 06/05/2024 (Approximate)   BMI 43.65 kg/m   General:  Well developed, well nourished, no acute distress Skin:  Warm and dry Neck:  Midline trachea, normal thyroid , good ROM, no lymphadenopathy Lungs; Clear to auscultation bilaterally Breast:  No dominant palpable mass, retraction, or nipple discharge Cardiovascular: Regular rate and  rhythm Abdomen:  Soft, non tender, no hepatosplenomegaly Pelvic:  External genitalia is normal in appearance, no lesions.  The vagina is normal in appearance. Urethra has no lesions or masses. The cervix is bulbous.  Uterus is felt to be normal size, shape, and contour.  No adnexal masses or tenderness noted.Bladder is non tender, no masses felt. Extremities/musculoskeletal:  No swelling or varicosities noted, no clubbing or cyanosis Psych:  No mood changes, alert and cooperative,seems happy AA is 38    06/25/2024   10:38 AM 02/20/2024   10:59 AM 11/20/2023   11:11 AM  Depression screen PHQ 2/9  Decreased Interest 1 1 0  Down, Depressed, Hopeless 1 1 0  PHQ - 2 Score 2 2 0  Altered sleeping 1 2 1   Tired, decreased energy 1 2 0  Change in appetite 2 2 0  Feeling bad or failure about yourself  1 1 1   Trouble concentrating 0 1 1  Moving slowly or fidgety/restless 0 0 0  Suicidal thoughts 0 0 0  PHQ-9 Score 7 10 3   Difficult doing work/chores   Somewhat difficult       06/25/2024   10:38 AM 02/20/2024   11:01 AM 11/20/2023   11:13 AM 09/25/2023   10:55 AM  GAD 7 : Generalized Anxiety Score  Nervous, Anxious, on Edge 1 1 1 1   Control/stop worrying 1 1 1 2   Worry too much - different things 1 1 1 2   Trouble relaxing 1 1 1 2   Restless 0 1 1 2   Easily annoyed or irritable 2 2  1 3  Afraid - awful might happen 0 0 0 1  Total GAD 7 Score 6 7 6 13   Anxiety Difficulty   Somewhat difficult Somewhat difficult      Upstream - 06/25/24 1036       Pregnancy Intention Screening   Does the patient want to become pregnant in the next year? No    Does the patient's partner want to become pregnant in the next year? No    Would the patient like to discuss contraceptive options today? No      Contraception Wrap Up   Current Method Abstinence    End Method Abstinence;Withdrawal or Other Method    Contraception Counseling Provided No         Examination chaperoned by Clarita Salt  LPN  Impression and plan: 1. Encounter for well woman exam with routine gynecological exam (Primary) Physical in 1 year Pap in 2027 Will check labs  - CBC - Comprehensive metabolic panel with GFR - Lipid panel  2. Anxiety and depression Good on prozac , will refill prozac  40 mg 1 daily and atarax  prn  3. Weight loss counseling, encounter for Decrease carbs and sweets Walk more Will rx phentermine  37.5 mg 1 daily Follow up in 4 weeks for weight and BP check   Meds ordered this encounter  Medications   phentermine  (ADIPEX-P ) 37.5 MG tablet    Sig: Take 1 tablet (37.5 mg total) by mouth daily before breakfast. Take 1 daily    Dispense:  30 tablet    Refill:  0    Supervising Provider:   JAYNE MINDER H [[id number]]   hydrOXYzine  (ATARAX ) 10 MG tablet    Sig: Take 1 tablet (10 mg total) by mouth 3 (three) times daily as needed.    Dispense:  30 tablet    Refill:  3    Supervising Provider:   JAYNE MINDER H [[id number]]   FLUoxetine  (PROZAC ) 40 MG capsule    Sig: Take 1 capsule (40 mg total) by mouth daily.    Dispense:  30 capsule    Refill:  6    Supervising Provider:   JAYNE MINDER H [[id number]]     4. Screening examination for STD (sexually transmitted disease) CV swab sent for GC/CHL.trich,BV and yeast  - Cervicovaginal ancillary only( [place])  5. Morbid obesity with BMI of 40.0-44.9, adult (HCC)   6. Screening cholesterol level - Lipid panel  7. Screening for thyroid  disorder  - TSH + free T4

## 2024-06-26 LAB — TSH+FREE T4
Free T4: 1 ng/dL (ref 0.82–1.77)
TSH: 1.14 u[IU]/mL (ref 0.450–4.500)

## 2024-06-26 LAB — COMPREHENSIVE METABOLIC PANEL WITH GFR
ALT: 31 IU/L (ref 0–32)
AST: 28 IU/L (ref 0–40)
Albumin: 4.6 g/dL (ref 3.9–4.9)
Alkaline Phosphatase: 84 IU/L (ref 44–121)
BUN/Creatinine Ratio: 14 (ref 9–23)
BUN: 10 mg/dL (ref 6–20)
Bilirubin Total: 0.3 mg/dL (ref 0.0–1.2)
CO2: 19 mmol/L — ABNORMAL LOW (ref 20–29)
Calcium: 9.7 mg/dL (ref 8.7–10.2)
Chloride: 101 mmol/L (ref 96–106)
Creatinine, Ser: 0.7 mg/dL (ref 0.57–1.00)
Globulin, Total: 2.3 g/dL (ref 1.5–4.5)
Glucose: 108 mg/dL — ABNORMAL HIGH (ref 70–99)
Potassium: 4.8 mmol/L (ref 3.5–5.2)
Sodium: 137 mmol/L (ref 134–144)
Total Protein: 6.9 g/dL (ref 6.0–8.5)
eGFR: 115 mL/min/{1.73_m2} (ref >59)

## 2024-06-26 LAB — CERVICOVAGINAL ANCILLARY ONLY
Bacterial Vaginitis (gardnerella): POSITIVE — AB
Candida Glabrata: NEGATIVE
Candida Vaginitis: NEGATIVE
Chlamydia: NEGATIVE
Comment: NEGATIVE
Comment: NEGATIVE
Comment: NEGATIVE
Comment: NEGATIVE
Comment: NEGATIVE
Comment: NORMAL
Neisseria Gonorrhea: NEGATIVE
Trichomonas: NEGATIVE

## 2024-06-26 LAB — CBC
Hematocrit: 45.4 % (ref 34.0–46.6)
Hemoglobin: 14.9 g/dL (ref 11.1–15.9)
MCH: 30.7 pg (ref 26.6–33.0)
MCHC: 32.8 g/dL (ref 31.5–35.7)
MCV: 94 fL (ref 79–97)
Platelets: 339 10*3/uL (ref 150–450)
RBC: 4.85 x10E6/uL (ref 3.77–5.28)
RDW: 12.4 % (ref 11.7–15.4)
WBC: 12.1 10*3/uL — ABNORMAL HIGH (ref 3.4–10.8)

## 2024-06-26 LAB — LIPID PANEL
Chol/HDL Ratio: 3.1 ratio (ref 0.0–4.4)
Cholesterol, Total: 229 mg/dL — ABNORMAL HIGH (ref 100–199)
HDL: 73 mg/dL (ref >39)
LDL Chol Calc (NIH): 140 mg/dL — ABNORMAL HIGH (ref 0–99)
Triglycerides: 94 mg/dL (ref 0–149)
VLDL Cholesterol Cal: 16 mg/dL (ref 5–40)

## 2024-07-02 ENCOUNTER — Ambulatory Visit: Payer: Self-pay | Admitting: Adult Health

## 2024-07-02 MED ORDER — METRONIDAZOLE 500 MG PO TABS: 500.0000 mg | ORAL_TABLET | Freq: Two times a day (BID) | ORAL | 0 refills | Status: DC

## 2024-07-23 ENCOUNTER — Encounter: Payer: Self-pay | Admitting: Adult Health

## 2024-07-23 ENCOUNTER — Ambulatory Visit: Admitting: Adult Health

## 2024-07-23 VITALS — BP 122/86 | HR 113 | Ht 61.0 in | Wt 225.5 lb

## 2024-07-23 DIAGNOSIS — Z713 Dietary counseling and surveillance: Secondary | ICD-10-CM

## 2024-07-23 DIAGNOSIS — Z6841 Body Mass Index (BMI) 40.0 and over, adult: Secondary | ICD-10-CM

## 2024-07-23 MED ORDER — METRONIDAZOLE 500 MG PO TABS: 500.0000 mg | ORAL_TABLET | Freq: Two times a day (BID) | ORAL | 0 refills | Status: DC

## 2024-07-23 MED ORDER — PHENTERMINE HCL 37.5 MG PO TABS: 37.5000 mg | ORAL_TABLET | Freq: Every day | ORAL | 0 refills | Status: DC

## 2024-07-23 NOTE — Progress Notes (Signed)
  Subjective:     Patient ID: Megan Contreras, female   DOB: Sep 10, 1986, 38 y.o.   MRN: [id number]  HPI Megan Contreras is a 38 year old white female,separated, G2P2002, in for a weight and BP check, she started phentermine  06/25/24 and has lost about 6 1/2 lbs. She didn't see note about flagyl  for BV on vaginal swab, will get now.     Component Value Date/Time   DIAGPAP  06/25/2023 1139    - Negative for intraepithelial lesion or malignancy (NILM)   DIAGPAP  08/19/2020 0920    - Negative for intraepithelial lesion or malignancy (NILM)   DIAGPAP (A) 08/30/2018 0000    LOW GRADE SQUAMOUS INTRAEPITHELIAL LESION: CIN-1/ HPV (LSIL).   HPVHIGH Negative 06/25/2023 1139   HPVHIGH Negative 08/19/2020 0920   ADEQPAP  06/25/2023 1139    Satisfactory for evaluation; transformation zone component ABSENT.   ADEQPAP  08/19/2020 0920    Satisfactory for evaluation; transformation zone component ABSENT.   ADEQPAP (A) 08/30/2018 0000    Satisfactory for evaluation  endocervical/transformation zone component PRESENT.      Review of Systems +weight loss Reviewed past medical,surgical, social and family history. Reviewed medications and allergies.     Objective:   Physical Exam BP 122/86 (BP Location: Right Arm, Patient Position: Sitting, Cuff Size: Large)   Pulse (!) 113   Ht 5' 1 (1.549 m)   Wt 225 lb 8 oz (102.3 kg)   LMP 06/30/2024 (Exact Date)   BMI 42.61 kg/m     Skin warm and dry.  Lungs: clear to ausculation bilaterally. Cardiovascular: regular rate and rhythm.   Upstream - 07/23/24 1024       Pregnancy Intention Screening   Does the patient want to become pregnant in the next year? No    Does the patient's partner want to become pregnant in the next year? No    Would the patient like to discuss contraceptive options today? No      Contraception Wrap Up   Current Method Abstinence    End Method Abstinence          Assessment:     1. Weight loss counseling, encounter for  (Primary) Continue weight loss efforts Will refill phentermine    Meds ordered this encounter  Medications   phentermine  (ADIPEX-P ) 37.5 MG tablet    Sig: Take 1 tablet (37.5 mg total) by mouth daily before breakfast. Take 1 daily    Dispense:  30 tablet    Refill:  0    Supervising Provider:   JAYNE MINDER H [[id number]]   metroNIDAZOLE  (FLAGYL ) 500 MG tablet    Sig: Take 1 tablet (500 mg total) by mouth 2 (two) times daily.    Dispense:  14 tablet    Refill:  0    Supervising Provider:   JAYNE MINDER H [[id number]]     2. Morbid obesity with BMI of 40.0-44.9, adult (HCC)     Plan:     Resent rx for flagyl  Return in 4 weeks for weight and BP check

## 2024-08-06 DIAGNOSIS — H5213 Myopia, bilateral: Secondary | ICD-10-CM | POA: Diagnosis not present

## 2024-08-20 ENCOUNTER — Ambulatory Visit: Admitting: Adult Health

## 2024-08-28 ENCOUNTER — Encounter: Payer: Self-pay | Admitting: Adult Health

## 2024-08-28 ENCOUNTER — Ambulatory Visit: Admitting: Adult Health

## 2024-08-28 VITALS — BP 113/82 | HR 82 | Ht 61.0 in | Wt 218.0 lb

## 2024-08-28 DIAGNOSIS — Z713 Dietary counseling and surveillance: Secondary | ICD-10-CM | POA: Diagnosis not present

## 2024-08-28 DIAGNOSIS — Z6841 Body Mass Index (BMI) 40.0 and over, adult: Secondary | ICD-10-CM | POA: Diagnosis not present

## 2024-08-28 MED ORDER — PHENTERMINE HCL 37.5 MG PO TABS: 37.5000 mg | ORAL_TABLET | Freq: Every day | ORAL | 0 refills | Status: AC

## 2024-08-28 NOTE — Progress Notes (Signed)
  Subjective:     Patient ID: Megan Contreras, female   DOB: January 06, 1986, 38 y.o.   MRN: [id number]  HPI Megan Contreras is a 38 year old white female, separated, G2P2002, in for a weight and BP check, she has been on phentermine  since July 2 and has lost 13 lbs total. Has stopped sodas.      Component Value Date/Time   DIAGPAP  06/25/2023 1139    - Negative for intraepithelial lesion or malignancy (NILM)   DIAGPAP  08/19/2020 0920    - Negative for intraepithelial lesion or malignancy (NILM)   DIAGPAP (A) 08/30/2018 0000    LOW GRADE SQUAMOUS INTRAEPITHELIAL LESION: CIN-1/ HPV (LSIL).   HPVHIGH Negative 06/25/2023 1139   HPVHIGH Negative 08/19/2020 0920   ADEQPAP  06/25/2023 1139    Satisfactory for evaluation; transformation zone component ABSENT.   ADEQPAP  08/19/2020 0920    Satisfactory for evaluation; transformation zone component ABSENT.   ADEQPAP (A) 08/30/2018 0000    Satisfactory for evaluation  endocervical/transformation zone component PRESENT.     Review of Systems +weight loss Has more energy and feels good Reviewed past medical,surgical, social and family history. Reviewed medications and allergies.     Objective:   Physical Exam BP 113/82 (BP Location: Left Arm, Patient Position: Sitting, Cuff Size: Large)   Pulse 82   Ht 5' 1 (1.549 m)   Wt 218 lb (98.9 kg)   LMP 08/23/2024 (Exact Date)   BMI 41.19 kg/m  lost 7.5 lbs since last visit, total of 13 lbs since 06/25/24.   Skin warm and dry. Lungs: clear to ausculation bilaterally. Cardiovascular: regular rate and rhythm. Fall risk is low  Upstream - 08/28/24 1059       Pregnancy Intention Screening   Does the patient want to become pregnant in the next year? No    Does the patient's partner want to become pregnant in the next year? No    Would the patient like to discuss contraceptive options today? No      Contraception Wrap Up   Current Method Withdrawal or Other Method    End Method Withdrawal or Other Method            Assessment:     1. Weight loss counseling, encounter for (Primary) Continue weight loss efforts Will refill phentermine  Meds ordered this encounter  Medications   phentermine  (ADIPEX-P ) 37.5 MG tablet    Sig: Take 1 tablet (37.5 mg total) by mouth daily before breakfast. Take 1 daily    Dispense:  30 tablet    Refill:  0    Supervising Provider:   JAYNE MINDER H [[id number]]   Try to exercise more   2. Body mass index 40.0-44.9, adult (HCC)     Plan:     Return in 4 weeks for weight and BP check

## 2024-09-25 ENCOUNTER — Ambulatory Visit: Admitting: Adult Health

## 2024-09-25 ENCOUNTER — Encounter: Payer: Self-pay | Admitting: Adult Health

## 2024-09-25 VITALS — BP 136/87 | HR 96 | Ht 61.0 in | Wt 209.0 lb

## 2024-09-25 DIAGNOSIS — Z713 Dietary counseling and surveillance: Secondary | ICD-10-CM

## 2024-09-25 DIAGNOSIS — Z3202 Encounter for pregnancy test, result negative: Secondary | ICD-10-CM

## 2024-09-25 LAB — POCT URINE PREGNANCY: Preg Test, Ur: NEGATIVE

## 2024-09-25 NOTE — Progress Notes (Signed)
  Subjective:     Patient ID: Megan Contreras, female   DOB: 08-02-1986, 38 y.o.   MRN: [id number]  HPI Gaylon is a 38 year old white female, separated, G2P2002 in for weight and BP check, she has lost 9 more lbs for total of 22 lbs since July on phentermine . She requests UPT, had +HPT and negative HPT and has started her period.     Component Value Date/Time   DIAGPAP  06/25/2023 1139    - Negative for intraepithelial lesion or malignancy (NILM)   DIAGPAP  08/19/2020 0920    - Negative for intraepithelial lesion or malignancy (NILM)   DIAGPAP (A) 08/30/2018 0000    LOW GRADE SQUAMOUS INTRAEPITHELIAL LESION: CIN-1/ HPV (LSIL).   HPVHIGH Negative 06/25/2023 1139   HPVHIGH Negative 08/19/2020 0920   ADEQPAP  06/25/2023 1139    Satisfactory for evaluation; transformation zone component ABSENT.   ADEQPAP  08/19/2020 0920    Satisfactory for evaluation; transformation zone component ABSENT.   ADEQPAP (A) 08/30/2018 0000    Satisfactory for evaluation  endocervical/transformation zone component PRESENT.     Review of Systems +weight loss Has started period Reviewed past medical,surgical, social and family history. Reviewed medications and allergies.     Objective:   Physical Exam BP 136/87 (BP Location: Right Arm, Patient Position: Sitting, Cuff Size: Large)   Pulse 96   Ht 5' 1 (1.549 m)   Wt 209 lb (94.8 kg)   LMP 09/24/2024 (Exact Date)   BMI 39.49 kg/m  UPT is negative Skin warm and dry.  Lungs: clear to ausculation bilaterally. Cardiovascular: regular rate and rhythm.  Upstream - 09/25/24 1152       Pregnancy Intention Screening   Does the patient want to become pregnant in the next year? No    Does the patient's partner want to become pregnant in the next year? No    Would the patient like to discuss contraceptive options today? No      Contraception Wrap Up   Current Method Withdrawal or Other Method    End Method Withdrawal or Other Method    Contraception  Counseling Provided No              Assessment:     1. Negative pregnancy test - POCT urine pregnancy Will check QHCG  - Beta hCG quant (ref lab)  2. Weight loss counseling, encounter for (Primary) Will stop phentermine  for now and continue weight loss on her on   Continue exercise and water and watching portions  Plan:     Follow up prn

## 2024-09-26 ENCOUNTER — Ambulatory Visit: Payer: Self-pay | Admitting: Adult Health

## 2024-09-26 LAB — BETA HCG QUANT (REF LAB): hCG Quant: <1 m[IU]/mL
